# Patient Record
Sex: Female | Born: 2005 | Race: Black or African American | Hispanic: No | Marital: Single | Smoking: Never smoker
Health system: Southern US, Community
[De-identification: ages and names within clinical notes are randomized; demographics above are authoritative.]

## PROBLEM LIST (undated history)

## (undated) ENCOUNTER — Inpatient Hospital Stay (HOSPITAL_COMMUNITY): Payer: Self-pay

## (undated) DIAGNOSIS — T7840XA Allergy, unspecified, initial encounter: Secondary | ICD-10-CM

## (undated) DIAGNOSIS — E0590 Thyrotoxicosis, unspecified without thyrotoxic crisis or storm: Secondary | ICD-10-CM

## (undated) DIAGNOSIS — J45909 Unspecified asthma, uncomplicated: Secondary | ICD-10-CM

## (undated) HISTORY — DX: Allergy, unspecified, initial encounter: T78.40XA

---
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
## 2024-02-09 ENCOUNTER — Encounter (HOSPITAL_COMMUNITY): Payer: Self-pay

## 2024-02-09 ENCOUNTER — Other Ambulatory Visit: Payer: Self-pay

## 2024-02-09 ENCOUNTER — Inpatient Hospital Stay (HOSPITAL_COMMUNITY)
Admission: AD | Admit: 2024-02-09 | Discharge: 2024-02-10 | Disposition: A | Discharge status: Home / Self Care | Attending: Obstetrics & Gynecology | Admitting: Obstetrics & Gynecology

## 2024-02-09 DIAGNOSIS — O219 Vomiting of pregnancy, unspecified: Secondary | ICD-10-CM

## 2024-02-09 DIAGNOSIS — K920 Hematemesis: Secondary | ICD-10-CM | POA: Diagnosis present

## 2024-02-09 DIAGNOSIS — Z3A01 Less than 8 weeks gestation of pregnancy: Secondary | ICD-10-CM

## 2024-02-09 LAB — URINALYSIS, ROUTINE W REFLEX MICROSCOPIC
Bilirubin Urine: NEGATIVE
Glucose, UA: NEGATIVE mg/dL
Hgb urine dipstick: NEGATIVE
Ketones, ur: 80 mg/dL — AB
Leukocytes,Ua: NEGATIVE
Nitrite: NEGATIVE
Protein, ur: 100 mg/dL — AB
Specific Gravity, Urine: 1.031 — ABNORMAL HIGH (ref 1.005–1.030)
pH: 5 (ref 5.0–8.0)

## 2024-02-09 LAB — POCT PREGNANCY, URINE: Preg Test, Ur: POSITIVE — AB

## 2024-02-09 MED ORDER — ONDANSETRON HCL 4 MG/2ML IJ SOLN
4.0000 mg | Freq: Once | INTRAMUSCULAR | Status: AC
  Administered 2024-02-09: 4 mg via INTRAVENOUS
  Filled 2024-02-09: qty 2

## 2024-02-09 MED ORDER — LACTATED RINGERS IV BOLUS
1000.0000 mL | Freq: Once | INTRAVENOUS | Status: AC
  Administered 2024-02-09: 1000 mL via INTRAVENOUS

## 2024-02-09 MED ORDER — ONDANSETRON 4 MG PO TBDP: 4.0000 mg | ORAL_TABLET | Freq: Three times a day (TID) | ORAL | 0 refills | Status: DC | PRN

## 2024-02-09 MED ORDER — FAMOTIDINE IN NACL 20-0.9 MG/50ML-% IV SOLN
20.0000 mg | Freq: Once | INTRAVENOUS | Status: AC
  Administered 2024-02-09: 20 mg via INTRAVENOUS
  Filled 2024-02-09: qty 50

## 2024-02-09 NOTE — MAU Provider Note (Signed)
 History     CSN: [id number]  Arrival date and time: 02/09/24 2100   Event Date/Time   First Provider Initiated Contact with Patient 02/09/24 2222      Chief Complaint  Patient presents with   Emesis During Pregnancy   Dawn Powell , a  18 y.o. G1P0000 at [redacted]w[redacted]d presents to MAU with complaints of nausea and vomiting since Friday. Patient reports that she has been unable to keep anything down since Friday. She reports several episodes of emesis to the point of throwing up blood. She denies attempting to relieve symptoms at home. She reports having some hamburger helper last night but states that she threw it up. She denies vaginal bleeding, abnormal vaginal discharge and urinary symptoms.          OB History     Gravida  1   Para  0   Term  0   Preterm  0   AB  0   Living         SAB  0   IAB  0   Ectopic  0   Multiple      Live Births              Past Medical History:  Diagnosis Date   Allergy    Asthma    Asthma    Hyperthyroidism     History reviewed. No pertinent surgical history.  Family History  Problem Relation Age of Onset   Thyroid  disease Mother     Social History   Tobacco Use   Smoking status: Never   Smokeless tobacco: Never  Vaping Use   Vaping status: Never Used  Substance Use Topics   Alcohol use: Never   Drug use: Not Currently    Types: Marijuana    Comment: not since last week    Allergies: No Known Allergies  Medications Prior to Admission  Medication Sig Dispense Refill Last Dose/Taking   albuterol  (PROAIR  HFA) 108 (90 Base) MCG/ACT inhaler Inhale 1-2 puffs into the lungs as needed for shortness of breath.   More than a month   fluticasone  (FLOVENT  HFA) 110 MCG/ACT inhaler Inhale 1 puff into the lungs as needed.   More than a month    Review of Systems  Constitutional:  Negative for chills, fatigue and fever.  Eyes:  Negative for pain and visual disturbance.  Respiratory:  Negative for apnea, shortness of  breath and wheezing.   Cardiovascular:  Negative for chest pain and palpitations.  Gastrointestinal:  Positive for nausea and vomiting. Negative for abdominal pain, constipation and diarrhea.  Genitourinary:  Negative for difficulty urinating, dysuria, pelvic pain, vaginal bleeding, vaginal discharge and vaginal pain.  Musculoskeletal:  Negative for back pain.  Neurological:  Negative for seizures, weakness and headaches.  Psychiatric/Behavioral:  Negative for suicidal ideas.    Physical Exam   Blood pressure 120/60, pulse 93, temperature 97.6 F (36.4 C), temperature source Oral, resp. rate 18, height 5' 5.5 (1.664 m), weight 57.7 kg, last menstrual period 12/30/2023, SpO2 100%.  Physical Exam Vitals and nursing note reviewed.  Constitutional:      General: She is not in acute distress.    Appearance: Normal appearance.  HENT:     Head: Normocephalic.  Pulmonary:     Effort: Pulmonary effort is normal.  Abdominal:     Palpations: Abdomen is soft.     Tenderness: There is no abdominal tenderness.  Musculoskeletal:     Cervical back: Normal range of motion.  Skin:    General: Skin is warm and dry.  Neurological:     Mental Status: She is alert and oriented to person, place, and time.  Psychiatric:        Mood and Affect: Mood normal.     MAU Course  Procedures Orders Placed This Encounter  Procedures   Urinalysis, Routine w reflex microscopic -Urine, Clean Catch   Pregnancy, urine POC   Insert peripheral IV   Discharge patient Discharge disposition: 01-Home or Self Care; Discharge patient date: 02/09/2024   Discharge patient Discharge disposition: 01-Home or Self Care; Discharge patient date: 02/09/2024   Meds ordered this encounter  Medications   ondansetron (ZOFRAN) injection 4 mg   famotidine (PEPCID) IVPB 20 mg premix   lactated ringers bolus 1,000 mL   ondansetron (ZOFRAN-ODT) 4 MG disintegrating tablet    Sig: Take 1 tablet (4 mg total) by mouth every 8 (eight)  hours as needed for nausea or vomiting.    Dispense:  15 tablet    Refill:  0    Supervising Provider:   PRATT, TANYA S [[id number]]    MDM - UA revealed some dehydration  - IV bolus and antiemetics ordered.  - Patient requesting Ice chips  - PO Challenged and successful plan for discharge.   Assessment and Plan   1. Nausea and vomiting during pregnancy   2. [redacted] weeks gestation of pregnancy    - Reviewed nausea and vomiting as a normal discomfort of pregnancy  - Rx for Zofran sent to outpatient pharmacy  - Reviewed worsening signs and return precautions.  - Recommended that patient seek prenatal care at a office of her choosing.  - Patient discharged home in stable condition and may return to MAU as needed.   Dawn CHRISTELLA Cedar, MSN CNM  02/09/2024, 10:22 PM

## 2024-02-09 NOTE — MAU Note (Signed)
 Dawn Powell is a 18 y.o. at Unknown here in MAU reporting: nausea and vomiting that started 2 days ago. Patient states that she has not been able to keep anything down since then. Patient states that she started blood in her vomit yesterday. Reports upper abdominal pain that is constant. Patient reports positive upt today. Denies any vaginal bleeding  LMP: 12/30/23 Onset of complaint: 2 days ago Pain score: 10/10 Vitals:   02/09/24 2129  BP: 114/72  Pulse: (!) 110  Resp: 18  SpO2: 100%     FHT: n/a  Lab orders placed from triage: urine

## 2024-02-10 ENCOUNTER — Inpatient Hospital Stay (EMERGENCY_DEPARTMENT_HOSPITAL)
Admission: AD | Admit: 2024-02-10 | Discharge: 2024-02-10 | Disposition: A | Discharge status: Home / Self Care | Source: Home / Self Care | Attending: Obstetrics and Gynecology | Admitting: Obstetrics and Gynecology

## 2024-02-10 DIAGNOSIS — O219 Vomiting of pregnancy, unspecified: Secondary | ICD-10-CM | POA: Insufficient documentation

## 2024-02-10 DIAGNOSIS — Z3A01 Less than 8 weeks gestation of pregnancy: Secondary | ICD-10-CM | POA: Insufficient documentation

## 2024-02-10 LAB — URINALYSIS, ROUTINE W REFLEX MICROSCOPIC
Bilirubin Urine: NEGATIVE
Glucose, UA: NEGATIVE mg/dL
Hgb urine dipstick: NEGATIVE
Ketones, ur: 20 mg/dL — AB
Leukocytes,Ua: NEGATIVE
Nitrite: NEGATIVE
Protein, ur: 100 mg/dL — AB
Specific Gravity, Urine: 1.032 — ABNORMAL HIGH (ref 1.005–1.030)
pH: 5 (ref 5.0–8.0)

## 2024-02-10 MED ORDER — SCOPOLAMINE 1 MG/3DAYS TD PT72
1.0000 | MEDICATED_PATCH | Freq: Once | TRANSDERMAL | Status: DC
  Administered 2024-02-10: 1 mg via TRANSDERMAL
  Filled 2024-02-10: qty 1

## 2024-02-10 MED ORDER — ONDANSETRON 4 MG PO TBDP
4.0000 mg | ORAL_TABLET | Freq: Once | ORAL | Status: AC
  Administered 2024-02-10: 4 mg via ORAL
  Filled 2024-02-10: qty 1

## 2024-02-10 MED ORDER — ONDANSETRON HCL 4 MG/2ML IJ SOLN
4.0000 mg | Freq: Once | INTRAMUSCULAR | Status: AC
  Administered 2024-02-10: 4 mg via INTRAVENOUS
  Filled 2024-02-10: qty 2

## 2024-02-10 MED ORDER — LIDOCAINE VISCOUS HCL 2 % MT SOLN
15.0000 mL | Freq: Once | OROMUCOSAL | Status: AC
  Administered 2024-02-10: 15 mL via OROMUCOSAL
  Filled 2024-02-10: qty 15

## 2024-02-10 MED ORDER — PROMETHAZINE HCL 25 MG PO TABS
25.0000 mg | ORAL_TABLET | Freq: Once | ORAL | Status: AC
  Administered 2024-02-10: 25 mg via ORAL
  Filled 2024-02-10: qty 1

## 2024-02-10 MED ORDER — PROMETHAZINE HCL 25 MG/ML IJ SOLN
25.0000 mg | Freq: Once | INTRAVENOUS | Status: AC
  Administered 2024-02-10: 25 mg via INTRAVENOUS
  Filled 2024-02-10: qty 1

## 2024-02-10 MED ORDER — ONDANSETRON 4 MG PO TBDP: 4.0000 mg | ORAL_TABLET | Freq: Four times a day (QID) | ORAL | 5 refills | Status: DC | PRN

## 2024-02-10 MED ORDER — PROMETHAZINE HCL 25 MG PO TABS: 25.0000 mg | ORAL_TABLET | Freq: Four times a day (QID) | ORAL | 0 refills | Status: DC | PRN

## 2024-02-10 MED ORDER — SCOPOLAMINE 1 MG/3DAYS TD PT72: 1.0000 | MEDICATED_PATCH | TRANSDERMAL | 11 refills | Status: DC

## 2024-02-10 MED ORDER — ALUM & MAG HYDROXIDE-SIMETH 200-200-20 MG/5ML PO SUSP
30.0000 mL | Freq: Once | ORAL | Status: AC
  Administered 2024-02-10: 30 mL via ORAL
  Filled 2024-02-10: qty 30

## 2024-02-10 NOTE — Progress Notes (Signed)
Patient requesting ice chips.

## 2024-02-10 NOTE — MAU Provider Note (Signed)
 History     [id number]  Arrival date and time: 02/10/24 1228    Chief Complaint  Patient presents with   Abdominal Pain   Emesis     HPI Dawn Powell is a 18 y.o. at [redacted]w[redacted]d by sure LMP with PMHx notable for n/a, who presents for nausea and vomiting.   Was seen last evening for the same issue Was given IVF and passed PO challenge  Reports she is having intense nausea for the past few days Had some pineapple and watermelon this morning and has had terrible nausea, vomiting, and pain in her chest since then Has ODT zofran at home, took an hour before this happened, was the only one she tried No vaginal bleeding No fevers No pelvic pain Having trouble with both solids and liquids      OB History     Gravida  1   Para  0   Term  0   Preterm  0   AB  0   Living         SAB  0   IAB  0   Ectopic  0   Multiple      Live Births              Past Medical History:  Diagnosis Date   Allergy    Asthma    Asthma    Hyperthyroidism     No past surgical history on file.  Family History  Problem Relation Age of Onset   Thyroid  disease Mother     Social History   Socioeconomic History   Marital status: Single    Spouse name: Not on file   Number of children: Not on file   Years of education: Not on file   Highest education level: Not on file  Occupational History   Not on file  Tobacco Use   Smoking status: Never   Smokeless tobacco: Never  Vaping Use   Vaping status: Never Used  Substance and Sexual Activity   Alcohol use: Never   Drug use: Not Currently    Types: Marijuana    Comment: not since last week   Sexual activity: Yes    Birth control/protection: Condom  Other Topics Concern   Not on file  Social History Narrative   ** Merged History Encounter **       Lives with mother [place], brothers NyJhell Stokes (2001), Tera Razor (2004), sister Lolitha Tortora (2008). In school at [employer]   Social Drivers of Health    Financial Resource Strain: Patient Declined (12/20/2023)   Overall Financial Resource Strain (CARDIA)    Difficulty of Paying Living Expenses: Patient declined  Food Insecurity: Not on file  Transportation Needs: Not on file  Physical Activity: Inactive (12/20/2023)   Exercise Vital Sign    Days of Exercise per Week: 1 day    Minutes of Exercise per Session: 0 min  Stress: Stress Concern Present (12/20/2023)   [employer] of Occupational Health - Occupational Stress Questionnaire    Feeling of Stress: To some extent  Social Connections: Unknown (12/20/2023)   Social Connection and Isolation Panel    Frequency of Communication with Friends and Family: Once a week    Frequency of Social Gatherings with Friends and Family: Never    Attends Religious Services: 1 to 4 times per year    Active Member of [employer] or Organizations: No    Attends Banker Meetings: Not on file    Marital  Status: Not on file  Intimate Partner Violence: Not on file    No Known Allergies  No current facility-administered medications on file prior to encounter.   Current Outpatient Medications on File Prior to Encounter  Medication Sig Dispense Refill   albuterol  (PROAIR  HFA) 108 (90 Base) MCG/ACT inhaler Inhale 1-2 puffs into the lungs as needed for shortness of breath.     fluticasone  (FLOVENT  HFA) 110 MCG/ACT inhaler Inhale 1 puff into the lungs as needed.       ROS Pertinent positives and negative per HPI, all others reviewed and negative  Physical Exam   BP 132/72   Pulse 98   Temp 98.2 F (36.8 C)   Resp 18   Ht 5' 5.5 (1.664 m)   Wt 58.7 kg   LMP 12/30/2023 (Exact Date)   BMI 21.19 kg/m   Patient Vitals for the past 24 hrs:  BP Temp Pulse Resp Height Weight  02/10/24 1346 132/72 98.2 F (36.8 C) 98 18 5' 5.5 (1.664 m) 58.7 kg    Physical Exam Vitals reviewed.  Constitutional:      General: She is not in acute distress.    Appearance: She is well-developed. She is not  diaphoretic.  Eyes:     General: No scleral icterus. Pulmonary:     Effort: Pulmonary effort is normal. No respiratory distress.  Abdominal:     General: There is no distension.     Palpations: Abdomen is soft.     Tenderness: There is no abdominal tenderness. There is no guarding or rebound.  Skin:    General: Skin is warm and dry.  Neurological:     Mental Status: She is alert.     Coordination: Coordination normal.      Cervical Exam    Bedside Ultrasound Not performed.  My interpretation: n/a   Labs Results for orders placed or performed during the hospital encounter of 02/10/24 (from the past 24 hours)  Urinalysis, Routine w reflex microscopic -Urine, Clean Catch     Status: Abnormal   Collection Time: 02/10/24  2:30 PM  Result Value Ref Range   Color, Urine AMBER (A) YELLOW   APPearance HAZY (A) CLEAR   Specific Gravity, Urine 1.032 (H) 1.005 - 1.030   pH 5.0 5.0 - 8.0   Glucose, UA NEGATIVE NEGATIVE mg/dL   Hgb urine dipstick NEGATIVE NEGATIVE   Bilirubin Urine NEGATIVE NEGATIVE   Ketones, ur 20 (A) NEGATIVE mg/dL   Protein, ur 899 (A) NEGATIVE mg/dL   Nitrite NEGATIVE NEGATIVE   Leukocytes,Ua NEGATIVE NEGATIVE   RBC / HPF 0-5 0 - 5 RBC/hpf   WBC, UA 0-5 0 - 5 WBC/hpf   Bacteria, UA FEW (A) NONE SEEN   Squamous Epithelial / HPF 11-20 0 - 5 /HPF   Mucus PRESENT     Imaging No results found.  MAU Course  Procedures Lab Orders         Urinalysis, Routine w reflex microscopic -Urine, Clean Catch    Meds ordered this encounter  Medications   ondansetron (ZOFRAN-ODT) disintegrating tablet 4 mg   alum & mag hydroxide-simeth (MAALOX/MYLANTA) 200-200-20 MG/5ML suspension 30 mL   lidocaine (XYLOCAINE) 2 % viscous mouth solution 15 mL   promethazine (PHENERGAN) tablet 25 mg   scopolamine (TRANSDERM-SCOP) 1 MG/3DAYS 1 mg   promethazine (PHENERGAN) 25 mg in lactated ringers 1,000 mL infusion   ondansetron (ZOFRAN) injection 4 mg   ondansetron (ZOFRAN-ODT) 4  MG disintegrating tablet    Sig: Take  1 tablet (4 mg total) by mouth every 6 (six) hours as needed for nausea or vomiting.    Dispense:  30 tablet    Refill:  5   scopolamine (TRANSDERM-SCOP) 1 MG/3DAYS    Sig: Place 1 patch (1 mg total) onto the skin every 3 (three) days.    Dispense:  10 patch    Refill:  11   promethazine (PHENERGAN) 25 MG tablet    Sig: Take 1 tablet (25 mg total) by mouth every 6 (six) hours as needed for nausea or vomiting.    Dispense:  30 tablet    Refill:  0   Imaging Orders  No imaging studies ordered today    MDM Moderate (Level 3-4)  Assessment and Plan  #Nausea and vomiting of pregnancy #[redacted] weeks gestation of pregnancy Initially doing well with PO meds and PO challenge but then had renewed nausea and vomiting. Subsequently given IVF and IV anti emetics and able to pass PO challenge. Rx sent for multiple meds, instructed to take scheduled, if fails this regimen then recommend IV infusion center visits.    Dispo: discharged to home in stable condition    Donnice CHRISTELLA Carolus, MD/MPH 02/10/24 8:03 PM  Allergies as of 02/10/2024   No Known Allergies      Medication List     TAKE these medications    Flovent  HFA 110 MCG/ACT inhaler Generic drug: fluticasone  Inhale 1 puff into the lungs as needed.   ondansetron 4 MG disintegrating tablet Commonly known as: ZOFRAN-ODT Take 1 tablet (4 mg total) by mouth every 6 (six) hours as needed for nausea or vomiting. What changed: when to take this   ProAir  HFA 108 (90 Base) MCG/ACT inhaler Generic drug: albuterol  Inhale 1-2 puffs into the lungs as needed for shortness of breath.   promethazine 25 MG tablet Commonly known as: PHENERGAN Take 1 tablet (25 mg total) by mouth every 6 (six) hours as needed for nausea or vomiting.   scopolamine 1 MG/3DAYS Commonly known as: TRANSDERM-SCOP Place 1 patch (1 mg total) onto the skin every 3 (three) days.

## 2024-02-10 NOTE — MAU Note (Signed)
 Dawn Powell is a 18 y.o. at [redacted]w[redacted]d here in MAU reporting: was seen last night for n/v. Stated symptoms are worse having burning in her chest and throat and abd pain . Has taken zofran today but only helped for about 1 hour  LMP:  Onset of complaint: this morning Pain score: 10 Vitals:   02/10/24 1346  BP: 132/72  Pulse: 98  Resp: 18  Temp: 98.2 F (36.8 C)     FHT: n/a  Lab orders placed from triage: u/a

## 2024-02-10 NOTE — Discharge Instructions (Signed)
 You were seen for nausea and vomiting. We gave you IV fluids and nausea medicines and you got better. We recommend you take nausea medicines round the clock. If this fails then we will set up outpatient IV infusions for you.

## 2024-02-11 ENCOUNTER — Encounter (HOSPITAL_COMMUNITY): Payer: Self-pay | Admitting: Obstetrics and Gynecology

## 2024-02-11 ENCOUNTER — Inpatient Hospital Stay (HOSPITAL_COMMUNITY)
Admission: AD | Admit: 2024-02-11 | Discharge: 2024-02-11 | Disposition: A | Discharge status: Home / Self Care | Payer: Self-pay | Attending: Obstetrics and Gynecology | Admitting: Obstetrics and Gynecology

## 2024-02-11 ENCOUNTER — Other Ambulatory Visit: Payer: Self-pay | Admitting: Nurse Practitioner

## 2024-02-11 DIAGNOSIS — O210 Mild hyperemesis gravidarum: Secondary | ICD-10-CM | POA: Diagnosis not present

## 2024-02-11 DIAGNOSIS — O211 Hyperemesis gravidarum with metabolic disturbance: Secondary | ICD-10-CM | POA: Diagnosis not present

## 2024-02-11 DIAGNOSIS — Z3A01 Less than 8 weeks gestation of pregnancy: Secondary | ICD-10-CM | POA: Diagnosis not present

## 2024-02-11 DIAGNOSIS — O99280 Endocrine, nutritional and metabolic diseases complicating pregnancy, unspecified trimester: Secondary | ICD-10-CM

## 2024-02-11 DIAGNOSIS — E876 Hypokalemia: Secondary | ICD-10-CM

## 2024-02-11 DIAGNOSIS — O99281 Endocrine, nutritional and metabolic diseases complicating pregnancy, first trimester: Secondary | ICD-10-CM | POA: Diagnosis not present

## 2024-02-11 DIAGNOSIS — E860 Dehydration: Secondary | ICD-10-CM | POA: Diagnosis not present

## 2024-02-11 LAB — URINALYSIS, ROUTINE W REFLEX MICROSCOPIC
Bilirubin Urine: NEGATIVE
Glucose, UA: NEGATIVE mg/dL
Hgb urine dipstick: NEGATIVE
Ketones, ur: 80 mg/dL — AB
Leukocytes,Ua: NEGATIVE
Nitrite: NEGATIVE
Protein, ur: 100 mg/dL — AB
Specific Gravity, Urine: 1.033 — ABNORMAL HIGH (ref 1.005–1.030)
pH: 6 (ref 5.0–8.0)

## 2024-02-11 LAB — COMPREHENSIVE METABOLIC PANEL WITH GFR
ALT: 14 U/L (ref 0–44)
AST: 18 U/L (ref 15–41)
Albumin: 3.9 g/dL (ref 3.5–5.0)
Alkaline Phosphatase: 38 U/L (ref 38–126)
Anion gap: 12 (ref 5–15)
BUN: 11 mg/dL (ref 6–20)
CO2: 20 mmol/L — ABNORMAL LOW (ref 22–32)
Calcium: 8.8 mg/dL — ABNORMAL LOW (ref 8.9–10.3)
Chloride: 102 mmol/L (ref 98–111)
Creatinine, Ser: 0.72 mg/dL (ref 0.44–1.00)
GFR, Estimated: >60 mL/min (ref >60)
Glucose, Bld: 70 mg/dL (ref 70–99)
Potassium: 2.9 mmol/L — ABNORMAL LOW (ref 3.5–5.1)
Sodium: 134 mmol/L — ABNORMAL LOW (ref 135–145)
Total Bilirubin: 1.1 mg/dL (ref 0.0–1.2)
Total Protein: 7.7 g/dL (ref 6.5–8.1)

## 2024-02-11 LAB — CBC
HCT: 34.1 % — ABNORMAL LOW (ref 36.0–46.0)
Hemoglobin: 11.5 g/dL — ABNORMAL LOW (ref 12.0–15.0)
MCH: 33 pg (ref 26.0–34.0)
MCHC: 33.7 g/dL (ref 30.0–36.0)
MCV: 98 fL (ref 80.0–100.0)
Platelets: 247 K/uL (ref 150–400)
RBC: 3.48 MIL/uL — ABNORMAL LOW (ref 3.87–5.11)
RDW: 11.9 % (ref 11.5–15.5)
WBC: 8.3 K/uL (ref 4.0–10.5)
nRBC: 0 % (ref 0.0–0.2)

## 2024-02-11 MED ORDER — POTASSIUM CHLORIDE CRYS ER 20 MEQ PO TBCR: 20.0000 meq | EXTENDED_RELEASE_TABLET | Freq: Two times a day (BID) | ORAL | 1 refills | Status: DC

## 2024-02-11 MED ORDER — LACTATED RINGERS IV BOLUS
1000.0000 mL | Freq: Once | INTRAVENOUS | Status: AC
  Administered 2024-02-11: 1000 mL via INTRAVENOUS

## 2024-02-11 MED ORDER — POTASSIUM CHLORIDE 10 MEQ/100ML IV SOLN
10.0000 meq | Freq: Once | INTRAVENOUS | Status: AC
  Administered 2024-02-11: 10 meq via INTRAVENOUS
  Filled 2024-02-11: qty 100

## 2024-02-11 MED ORDER — METOCLOPRAMIDE HCL 5 MG/ML IJ SOLN
10.0000 mg | Freq: Once | INTRAMUSCULAR | Status: AC
  Administered 2024-02-11: 10 mg via INTRAVENOUS
  Filled 2024-02-11: qty 2

## 2024-02-11 MED ORDER — ONDANSETRON HCL 4 MG/2ML IJ SOLN
4.0000 mg | Freq: Once | INTRAMUSCULAR | Status: AC
  Administered 2024-02-11: 4 mg via INTRAVENOUS
  Filled 2024-02-11: qty 2

## 2024-02-11 MED ORDER — FAMOTIDINE IN NACL 20-0.9 MG/50ML-% IV SOLN
20.0000 mg | Freq: Once | INTRAVENOUS | Status: AC
  Administered 2024-02-11: 20 mg via INTRAVENOUS
  Filled 2024-02-11: qty 50

## 2024-02-11 MED ORDER — POTASSIUM CHLORIDE 20 MEQ PO PACK
40.0000 meq | PACK | Freq: Once | ORAL | Status: AC
  Administered 2024-02-11: 40 meq via ORAL
  Filled 2024-02-11: qty 2

## 2024-02-11 MED ORDER — DEXTROSE 5 % IN LACTATED RINGERS IV BOLUS: 1000.0000 mL | Freq: Once | INTRAVENOUS | Status: DC

## 2024-02-11 MED ORDER — LACTATED RINGERS IV SOLN: INTRAVENOUS | Status: DC

## 2024-02-11 NOTE — Discharge Instructions (Signed)
 I have scheduled you to receive weekly IV Hydration at the infusion center. They will contact you with your appointments. Continue to take the medications we have prescribed for your Nausea and vomiting and the potassium supplements as ordered.

## 2024-02-11 NOTE — MAU Note (Signed)
.  Harlee Mcchristian is a 18 y.o. at [redacted]w[redacted]d here in MAU reporting: N/V and cramping since this morning. Unrelieved by zofran PO. Denies VB and LOF.  LMP: na Onset of complaint: 1100 Pain score: na Vitals:   02/11/24 1525  BP: 118/77  Pulse: 74  Resp: 16  Temp: 98.5 F (36.9 C)  SpO2: 100%     FHT: na  Lab orders placed from triage: ua

## 2024-02-11 NOTE — MAU Provider Note (Signed)
 Dawn Powell is a 18 y.o. G1P0000 patient who presents to MAU today with complaint of she is 6 weeks 1 day reporting that she has been having some lower abdominal cramping which is resolved now and that she is still having nausea and vomiting and unable to keep down p.o. food or fluids.  Patient has been seen on 02/09/2024 and 02/10/2024 for similar complaints.  On 02/10/2024 she was given a regimen of Zofran and ODT, Phenergan, scopolamine patch, and also lidocaine viscus mouth solution.  Patient was also advised at her last visit if this regimen of antiemetics does not work for her then likely she will require IV infusion center visits.  Patient offers no OB complaints she denies vaginal bleeding leaking of fluid and offers no urinary or vaginal complaints.  She denies vaginal discharge, burning, irritation and denies any dysuria.  Patient does have a scopolamine patch on now and reports she did not pick up the Phenergan and her last Zofran was at 12 noon which she reports she was unable to keep down.  Prenatal care is scheduled to be received at [facility]    O BP (!) 121/59   Pulse 79   Temp 98.5 F (36.9 C) (Axillary)   Resp 16   Ht 5' 5.5 (1.664 m)   Wt 59.2 kg   LMP 12/30/2023 (Exact Date)   SpO2 100%   BMI 21.39 kg/m  Physical Exam Vitals and nursing note reviewed.  Constitutional:      Appearance: She is well-developed. She is ill-appearing.  HENT:     Head: Normocephalic.     Nose: Nose normal.     Mouth/Throat:     Mouth: Mucous membranes are moist.  Cardiovascular:     Rate and Rhythm: Normal rate.  Pulmonary:     Effort: Pulmonary effort is normal.  Abdominal:     Palpations: Abdomen is soft.     Tenderness: There is no abdominal tenderness.  Musculoskeletal:        General: Normal range of motion.     Cervical back: Normal range of motion.  Skin:    General: Skin is warm.     Coloration: Skin is pale.  Neurological:     Mental Status: She is alert and  oriented to person, place, and time.     Motor: Weakness present.  Psychiatric:        Behavior: Behavior normal.      MDM  HIGH  N/V in pregnancy  CBC: NM CMP:  Hypokalemia at  2.9 ( Will order [place] x 1 and oral potassium supplements)  UA: C/W Large ketones and protein c/w Dehydration IVF- LR Bolus given with LR Maintainance fluids Antiemetics ordered ( Reglan IVP) and (Pepcid IV for heartburn Dawn/Dawn) PO Challenge tolerated  EKG ordered due to hypokalemia ( NSR)   Differential diagnosis with nausea/vomiting includes but is not limited to: nausea due to pregnancy, hyperemesis gravidarum, viral infection, gastroenteritis, cholecystitis, pancreatitis, ACS, food poisoning, diabetic ketoacidosis, drug ingestion, gastroparesis, alcohol/opiate withdrawal   Orders Placed This Encounter  Procedures   Urinalysis, Routine w reflex microscopic -Urine, Clean Catch    Standing Status:   Standing    Number of Occurrences:   1    Specimen Source:   Urine, Clean Catch [76]   CBC    Standing Status:   Standing    Number of Occurrences:   1   Comprehensive metabolic panel    Standing Status:   Standing  Number of Occurrences:   1   EKG 12-Lead    Standing Status:   Standing    Number of Occurrences:   1    Notes:   Hypokalemia    Meds ordered this encounter  Medications   lactated ringers bolus 1,000 mL   metoCLOPramide (REGLAN) injection 10 mg   famotidine (PEPCID) IVPB 20 mg premix   ondansetron (ZOFRAN) injection 4 mg   DISCONTD: dextrose 5% lactated ringers bolus 1,000 mL   potassium chloride 10 mEq in 100 mL IVPB   potassium chloride (KLOR-CON) packet 40 mEq   lactated ringers infusion     Results for orders placed or performed during the hospital encounter of 02/11/24 (from the past 24 hours)  Urinalysis, Routine w reflex microscopic -Urine, Clean Catch     Status: Abnormal   Collection Time: 02/11/24  3:17 PM  Result Value Ref Range   Color, Urine AMBER (A) YELLOW    APPearance HAZY (A) CLEAR   Specific Gravity, Urine 1.033 (H) 1.005 - 1.030   pH 6.0 5.0 - 8.0   Glucose, UA NEGATIVE NEGATIVE mg/dL   Hgb urine dipstick NEGATIVE NEGATIVE   Bilirubin Urine NEGATIVE NEGATIVE   Ketones, ur 80 (A) NEGATIVE mg/dL   Protein, ur 899 (A) NEGATIVE mg/dL   Nitrite NEGATIVE NEGATIVE   Leukocytes,Ua NEGATIVE NEGATIVE   RBC / HPF 0-5 0 - 5 RBC/hpf   WBC, UA 0-5 0 - 5 WBC/hpf   Bacteria, UA RARE (A) NONE SEEN   Squamous Epithelial / HPF 6-10 0 - 5 /HPF   Mucus PRESENT   CBC     Status: Abnormal   Collection Time: 02/11/24  5:15 PM  Result Value Ref Range   WBC 8.3 4.0 - 10.5 K/uL   RBC 3.48 (L) 3.87 - 5.11 MIL/uL   Hemoglobin 11.5 (L) 12.0 - 15.0 g/dL   HCT 65.8 (L) 63.9 - 53.9 %   MCV 98.0 80.0 - 100.0 fL   MCH 33.0 26.0 - 34.0 pg   MCHC 33.7 30.0 - 36.0 g/dL   RDW 88.0 88.4 - 84.4 %   Platelets 247 150 - 400 K/uL   nRBC 0.0 0.0 - 0.2 %  Comprehensive metabolic panel     Status: Abnormal   Collection Time: 02/11/24  5:15 PM  Result Value Ref Range   Sodium 134 (L) 135 - 145 mmol/L   Potassium 2.9 (L) 3.5 - 5.1 mmol/L   Chloride 102 98 - 111 mmol/L   CO2 20 (L) 22 - 32 mmol/L   Glucose, Bld 70 70 - 99 mg/dL   BUN 11 6 - 20 mg/dL   Creatinine, Ser 9.27 0.44 - 1.00 mg/dL   Calcium 8.8 (L) 8.9 - 10.3 mg/dL   Total Protein 7.7 6.5 - 8.1 g/dL   Albumin 3.9 3.5 - 5.0 g/dL   AST 18 15 - 41 U/L   ALT 14 0 - 44 U/L   Alkaline Phosphatase 38 38 - 126 U/L   Total Bilirubin 1.1 0.0 - 1.2 mg/dL   GFR, Estimated >39 >39 mL/min   Anion gap 12 5 - 15      I have reviewed the patient chart and performed the physical exam . I have ordered & interpreted the lab results and reviewed them with her  Medications ordered as stated above/ below.  A/P as described below.  Counseling and education provided and patient agreeable  with plan as described below. Verbalized understanding.    ASSESSMENT Medical  screening exam complete  Hyperemesis arising during  pregnancy  Hypokalemia  Dehydration during pregnancy  [redacted] weeks gestation of pregnancy    PLAN Future Appointments  Date Time Provider Department Center  02/26/2024  9:15 AM [facility] INTAKE [facility] None    Discharge from MAU in stable condition  IV Infusions for hyperemesis  ordered ( Message sent to the infusion center for scheduling)  See AVS for full description of educational information and instructions provided to the patient at time of discharge   Warning signs for worsening condition that would warrant emergency follow-up discussed Patient may return to MAU as needed  ------------------------------------------------------------------------------------------------------------- Olam Dalton, MSN, [facility], Center for [facility]   This chart was dictated using voice recognition software, Dragon. Despite the best efforts of this provider to proofread and correct errors, errors may still occur which can change documentation meaning.

## 2024-02-11 NOTE — MAU Note (Addendum)
 Dawn Powell is a 18 y.o. at [redacted]w[redacted]d here in MAU reporting: had some cramps in lower abd this morning- going now.  Hasn't been able to keep down any of the meds they gave her to take or any food. She's hungry, but everything is just so nasty. Upper abd is sore, like she is going to throw up, calmed down after eating ice. then it feels stuck in her throat and make it hard to breath.  Onset of complaint: ongoing. Pain score: mild Vitals:   02/11/24 1525  BP: 118/77  Pulse: 74  Resp: 16  Temp: 98.5 F (36.9 C)  SpO2: 100%      Lab orders placed from triage:  urine in processing    Has scope patch on .  Did not pick up Phenergan.  Last Zofran was at noon, she threw it up. Pt to lobby. Not actively retching or vomiting.

## 2024-02-15 ENCOUNTER — Other Ambulatory Visit: Payer: Self-pay

## 2024-02-15 ENCOUNTER — Encounter (HOSPITAL_COMMUNITY): Payer: Self-pay | Admitting: Obstetrics and Gynecology

## 2024-02-15 ENCOUNTER — Other Ambulatory Visit: Payer: Self-pay | Admitting: Nurse Practitioner

## 2024-02-15 ENCOUNTER — Inpatient Hospital Stay (HOSPITAL_COMMUNITY)
Admission: AD | Admit: 2024-02-15 | Discharge: 2024-02-15 | Disposition: A | Discharge status: Home / Self Care | Attending: Obstetrics and Gynecology | Admitting: Obstetrics and Gynecology

## 2024-02-15 DIAGNOSIS — Z3A01 Less than 8 weeks gestation of pregnancy: Secondary | ICD-10-CM

## 2024-02-15 DIAGNOSIS — O26891 Other specified pregnancy related conditions, first trimester: Secondary | ICD-10-CM

## 2024-02-15 DIAGNOSIS — O211 Hyperemesis gravidarum with metabolic disturbance: Secondary | ICD-10-CM | POA: Insufficient documentation

## 2024-02-15 DIAGNOSIS — R1030 Lower abdominal pain, unspecified: Secondary | ICD-10-CM | POA: Insufficient documentation

## 2024-02-15 DIAGNOSIS — O26899 Other specified pregnancy related conditions, unspecified trimester: Secondary | ICD-10-CM

## 2024-02-15 DIAGNOSIS — R1110 Vomiting, unspecified: Secondary | ICD-10-CM | POA: Diagnosis not present

## 2024-02-15 DIAGNOSIS — R109 Unspecified abdominal pain: Secondary | ICD-10-CM | POA: Diagnosis not present

## 2024-02-15 DIAGNOSIS — O210 Mild hyperemesis gravidarum: Secondary | ICD-10-CM

## 2024-02-15 DIAGNOSIS — O219 Vomiting of pregnancy, unspecified: Secondary | ICD-10-CM | POA: Diagnosis present

## 2024-02-15 LAB — CBC
HCT: 39 % (ref 36.0–46.0)
Hemoglobin: 13.1 g/dL (ref 12.0–15.0)
MCH: 32.9 pg (ref 26.0–34.0)
MCHC: 33.6 g/dL (ref 30.0–36.0)
MCV: 98 fL (ref 80.0–100.0)
Platelets: 300 K/uL (ref 150–400)
RBC: 3.98 MIL/uL (ref 3.87–5.11)
RDW: 11.6 % (ref 11.5–15.5)
WBC: 7.7 K/uL (ref 4.0–10.5)
nRBC: 0 % (ref 0.0–0.2)

## 2024-02-15 LAB — COMPREHENSIVE METABOLIC PANEL WITH GFR
ALT: 13 U/L (ref 0–44)
AST: 20 U/L (ref 15–41)
Albumin: 4.5 g/dL (ref 3.5–5.0)
Alkaline Phosphatase: 45 U/L (ref 38–126)
Anion gap: 12 (ref 5–15)
BUN: 12 mg/dL (ref 6–20)
CO2: 21 mmol/L — ABNORMAL LOW (ref 22–32)
Calcium: 9.8 mg/dL (ref 8.9–10.3)
Chloride: 105 mmol/L (ref 98–111)
Creatinine, Ser: 0.82 mg/dL (ref 0.44–1.00)
GFR, Estimated: >60 mL/min
Glucose, Bld: 81 mg/dL (ref 70–99)
Potassium: 3.5 mmol/L (ref 3.5–5.1)
Sodium: 138 mmol/L (ref 135–145)
Total Bilirubin: 0.9 mg/dL (ref 0.0–1.2)
Total Protein: 9.1 g/dL — ABNORMAL HIGH (ref 6.5–8.1)

## 2024-02-15 LAB — URINALYSIS, ROUTINE W REFLEX MICROSCOPIC
Bilirubin Urine: NEGATIVE
Glucose, UA: NEGATIVE mg/dL
Hgb urine dipstick: NEGATIVE
Ketones, ur: 20 mg/dL — AB
Leukocytes,Ua: NEGATIVE
Nitrite: NEGATIVE
Protein, ur: 100 mg/dL — AB
Specific Gravity, Urine: 1.032 — ABNORMAL HIGH (ref 1.005–1.030)
pH: 6 (ref 5.0–8.0)

## 2024-02-15 LAB — MAGNESIUM: Magnesium: 2 mg/dL (ref 1.7–2.4)

## 2024-02-15 MED ORDER — METOCLOPRAMIDE HCL 10 MG PO TABS: 10.0000 mg | ORAL_TABLET | Freq: Four times a day (QID) | ORAL | 0 refills | Status: DC

## 2024-02-15 MED ORDER — FAMOTIDINE 20 MG PO TABS: 20.0000 mg | ORAL_TABLET | Freq: Two times a day (BID) | ORAL | 0 refills | Status: DC

## 2024-02-15 MED ORDER — GLYCOPYRROLATE 2 MG PO TABS: 2.0000 mg | ORAL_TABLET | Freq: Three times a day (TID) | ORAL | 3 refills | Status: DC | PRN

## 2024-02-15 MED ORDER — GLYCOPYRROLATE 0.2 MG/ML IJ SOLN
0.2000 mg | Freq: Once | INTRAMUSCULAR | Status: AC
  Administered 2024-02-15: 0.2 mg via INTRAVENOUS
  Filled 2024-02-15: qty 1

## 2024-02-15 MED ORDER — FAMOTIDINE IN NACL 20-0.9 MG/50ML-% IV SOLN
20.0000 mg | Freq: Once | INTRAVENOUS | Status: AC
  Administered 2024-02-15: 20 mg via INTRAVENOUS

## 2024-02-15 MED ORDER — LACTATED RINGERS IV BOLUS
1000.0000 mL | Freq: Once | INTRAVENOUS | Status: AC
  Administered 2024-02-15: 1000 mL via INTRAVENOUS

## 2024-02-15 MED ORDER — METOCLOPRAMIDE HCL 5 MG/ML IJ SOLN
10.0000 mg | Freq: Once | INTRAMUSCULAR | Status: AC
  Administered 2024-02-15: 10 mg via INTRAVENOUS
  Filled 2024-02-15: qty 2

## 2024-02-15 NOTE — MAU Provider Note (Signed)
 S Ms. Dawn Powell is a 18 y.o. G1P0000 patient who presents to MAU today with complaint of stating that she is 6 weeks 5 days reporting she is having lower abdominal pain and consistent nausea and vomiting..  Patient has been seen here in the MAU for similar complaints on 02/09/2024, 02/10/2024, 02/11/2024 and again today.  IV fluid hydration orders have been placed at the the infusion center and patient reports she is awaiting her appointments since  they have not called her yet.  Patient has a regimen for hyperemesis at home which consists of Zofran, Phenergan, Reglan, scopolamine patch, and Pepcid.  She was also taking potassium supplements for hypokalemia that was found on 02/11/2024.  She reports that the inpatient regimen given on 02/11/2024 was helpful and she would like to be rehydrated again with similar medications.  Patient  denied any illicit drug use and states she has not used marijuana since she found out she was pregnant.  O BP 136/77 (BP Location: Right Arm)   Pulse 90   Temp 98.4 F (36.9 C) (Oral)   Resp 20   Ht 5' 5.5 (1.664 m)   Wt 58.4 kg   LMP 12/30/2023 (Exact Date)   SpO2 100%   BMI 21.09 kg/m  Physical Exam Vitals and nursing note reviewed.  Constitutional:      General: She is not in acute distress.    Appearance: Normal appearance. She is ill-appearing.  HENT:     Head: Normocephalic.     Nose: Nose normal.  Cardiovascular:     Rate and Rhythm: Normal rate.  Pulmonary:     Effort: Pulmonary effort is normal.  Abdominal:     Palpations: Abdomen is soft.  Musculoskeletal:        General: Normal range of motion.     Cervical back: Normal range of motion.  Skin:    General: Skin is warm.  Neurological:     Mental Status: She is alert and oriented to person, place, and time.  Psychiatric:        Behavior: Behavior normal.    MDM  HIGH  N/V in pregnancy  CBC: NM CMP: Potassium improved at 3.5 Serum magnesium: 2.0 UA (ketones and proteinuria  present consistent with dehydration) IVF LR Bolus Antiemetics (IV push Reglan, Robinul, Pepcid IV piggyback) patient reports that Zofran is not effective for her PO Challenge tolerated     Differential diagnosis with nausea/vomiting includes but is not limited to: nausea due to pregnancy, hyperemesis gravidarum, viral infection, gastroenteritis, cholecystitis, pancreatitis, ACS, food poisoning, diabetic ketoacidosis, drug ingestion, gastroparesis, alcohol/opiate withdrawal   Orders Placed This Encounter  Procedures   Urinalysis, Routine w reflex microscopic -Urine, Clean Catch    Standing Status:   Standing    Number of Occurrences:   1    Specimen Source:   Urine, Clean Catch [76]   CBC    Standing Status:   Standing    Number of Occurrences:   1   Comprehensive metabolic panel    Standing Status:   Standing    Number of Occurrences:   1   Magnesium    Standing Status:   Standing    Number of Occurrences:   1    Meds ordered this encounter  Medications   lactated ringers bolus 1,000 mL   glycopyrrolate (ROBINUL) injection 0.2 mg   metoCLOPramide (REGLAN) injection 10 mg   famotidine (PEPCID) IVPB 20 mg premix      Results for orders placed  or performed during the hospital encounter of 02/15/24 (from the past 24 hours)  Urinalysis, Routine w reflex microscopic -Urine, Clean Catch     Status: Abnormal   Collection Time: 02/15/24 12:29 PM  Result Value Ref Range   Color, Urine AMBER (A) YELLOW   APPearance HAZY (A) CLEAR   Specific Gravity, Urine 1.032 (H) 1.005 - 1.030   pH 6.0 5.0 - 8.0   Glucose, UA NEGATIVE NEGATIVE mg/dL   Hgb urine dipstick NEGATIVE NEGATIVE   Bilirubin Urine NEGATIVE NEGATIVE   Ketones, ur 20 (A) NEGATIVE mg/dL   Protein, ur 899 (A) NEGATIVE mg/dL   Nitrite NEGATIVE NEGATIVE   Leukocytes,Ua NEGATIVE NEGATIVE   RBC / HPF 0-5 0 - 5 RBC/hpf   WBC, UA 0-5 0 - 5 WBC/hpf   Bacteria, UA MANY (A) NONE SEEN   Squamous Epithelial / HPF 11-20 0 - 5 /HPF    Mucus PRESENT   CBC     Status: None   Collection Time: 02/15/24  1:30 PM  Result Value Ref Range   WBC 7.7 4.0 - 10.5 K/uL   RBC 3.98 3.87 - 5.11 MIL/uL   Hemoglobin 13.1 12.0 - 15.0 g/dL   HCT 60.9 63.9 - 53.9 %   MCV 98.0 80.0 - 100.0 fL   MCH 32.9 26.0 - 34.0 pg   MCHC 33.6 30.0 - 36.0 g/dL   RDW 88.3 88.4 - 84.4 %   Platelets 300 150 - 400 K/uL   nRBC 0.0 0.0 - 0.2 %  Comprehensive metabolic panel     Status: Abnormal   Collection Time: 02/15/24  1:30 PM  Result Value Ref Range   Sodium 138 135 - 145 mmol/L   Potassium 3.5 3.5 - 5.1 mmol/L   Chloride 105 98 - 111 mmol/L   CO2 21 (L) 22 - 32 mmol/L   Glucose, Bld 81 70 - 99 mg/dL   BUN 12 6 - 20 mg/dL   Creatinine, Ser 9.17 0.44 - 1.00 mg/dL   Calcium 9.8 8.9 - 89.6 mg/dL   Total Protein 9.1 (H) 6.5 - 8.1 g/dL   Albumin 4.5 3.5 - 5.0 g/dL   AST 20 15 - 41 U/L   ALT 13 0 - 44 U/L   Alkaline Phosphatase 45 38 - 126 U/L   Total Bilirubin 0.9 0.0 - 1.2 mg/dL   GFR, Estimated >39 >39 mL/min   Anion gap 12 5 - 15  Magnesium     Status: None   Collection Time: 02/15/24  1:30 PM  Result Value Ref Range   Magnesium 2.0 1.7 - 2.4 mg/dL     I have reviewed the patient chart and performed the physical exam . I have ordered & interpreted the lab results and reviewed them with the patient Medications ordered as stated below.  A/P as described below.  Counseling and education provided and patient agreeable  with plan as described below. Verbalized understanding.    ASSESSMENT Medical screening exam complete  Hyperemesis arising during pregnancy  Abdominal pain affecting pregnancy  Hyperemesis  [redacted] weeks gestation of pregnancy     PLAN Future Appointments  Date Time Provider Department Center  02/26/2024  9:15 AM [facility] INTAKE [facility] None    Discharge from MAU in stable condition  See AVS for full description of educational information and instructions provided to the patient at time of discharge  Warning signs  for worsening condition that would warrant emergency follow-up discussed Patient may return to MAU as needed  Littie Olam LABOR, NP 02/15/2024 3:43 PM   This chart was dictated using voice recognition software, Dragon. Despite the best efforts of this provider to proofread and correct errors, errors may still occur which can change documentation meaning.

## 2024-02-15 NOTE — Discharge Instructions (Signed)
 Orders have been placed for Weekly IV Fluid Hydration and I have sent them a message today to contact you . You should receive a call with appointments by Tuesday 02/18/24. If not please call the [facility] where you will be receiving your Prenatal care

## 2024-02-15 NOTE — MAU Note (Signed)
 Dawn Powell is a 18 y.o. at [redacted]w[redacted]d here in MAU reporting: she's having lower abdominal pain that hurts will all activity.  Denies VB.  Also reports is having N/V.  Reports able to keep down food & liquids at times.    LMP: 12/30/2023 Onset of complaint: ongoing Pain score: 10 Vitals:   02/15/24 1231  BP: 136/77  Pulse: 90  Resp: 20  Temp: 98.4 F (36.9 C)  SpO2: 100%     FHT: NA  Lab orders placed from triage: UA

## 2024-02-15 NOTE — MAU Note (Signed)
 Ivf's finished, pt reports she feels better. Reports pain is better. No vomiting since ivf's started. Provider notified

## 2024-02-19 ENCOUNTER — Other Ambulatory Visit (HOSPITAL_COMMUNITY): Payer: Self-pay | Admitting: Nurse Practitioner

## 2024-02-20 ENCOUNTER — Inpatient Hospital Stay (HOSPITAL_COMMUNITY)
Admission: RE | Admit: 2024-02-20 | Discharge: 2024-02-20 | Disposition: A | Discharge status: Home / Self Care | Source: Ambulatory Visit | Attending: Nurse Practitioner | Admitting: Nurse Practitioner

## 2024-02-26 ENCOUNTER — Other Ambulatory Visit (HOSPITAL_COMMUNITY)
Admission: RE | Admit: 2024-02-26 | Discharge: 2024-02-26 | Disposition: A | Discharge status: Home / Self Care | Source: Ambulatory Visit | Attending: Obstetrics | Admitting: Obstetrics

## 2024-02-26 ENCOUNTER — Other Ambulatory Visit (INDEPENDENT_AMBULATORY_CARE_PROVIDER_SITE_OTHER): Payer: Self-pay

## 2024-02-26 ENCOUNTER — Ambulatory Visit (INDEPENDENT_AMBULATORY_CARE_PROVIDER_SITE_OTHER): Payer: Self-pay | Admitting: *Deleted

## 2024-02-26 VITALS — BP 131/79 | HR 103

## 2024-02-26 DIAGNOSIS — Z3401 Encounter for supervision of normal first pregnancy, first trimester: Secondary | ICD-10-CM

## 2024-02-26 DIAGNOSIS — O3680X0 Pregnancy with inconclusive fetal viability, not applicable or unspecified: Secondary | ICD-10-CM

## 2024-02-26 DIAGNOSIS — Z3A08 8 weeks gestation of pregnancy: Secondary | ICD-10-CM

## 2024-02-26 DIAGNOSIS — Z3480 Encounter for supervision of other normal pregnancy, unspecified trimester: Secondary | ICD-10-CM | POA: Insufficient documentation

## 2024-02-26 MED ORDER — VITAFOL GUMMIES 3.33-0.333-34.8 MG PO CHEW: 3.0000 | CHEWABLE_TABLET | Freq: Every day | ORAL | 11 refills | Status: AC

## 2024-02-26 MED ORDER — BLOOD PRESSURE KIT DEVI: 1.0000 | 0 refills | Status: DC

## 2024-02-26 NOTE — Patient Instructions (Signed)
 The [facility] for [employer] has a partnership with the [employer] to provide prenatal navigation for the most needed resources in our community. In order to see how we can help connect you to these resources we need consent to contact you. Please complete the very short consent using the link below:   English Link: https://[place].tfaforms.net/283?site=16  Spanish Link: https://[place].tfaforms.net/287?site=16  Our practice his participating in a study that provides no-cost doula care. ACURE4Moms is a study looking at how doula care can reduce birthing disparities for Black and brown birthing people. We like to refer patients as soon as possible, but definitely before 28 weeks so patients can get to know their doula.    A doula is trained to provide support before, during and just after you give birth. While doulas do not provide medical care, they do provide emotional, physical and educational support. Doulas can help reduce your stress and comfort you and your partner. They can help you cope with labor by helping you use breathing techniques, massage, creative labor positioning, essential oils and affirmations.   ACURE4Moms is a research study trying to reduce:   low birthweight babies  emergency department visits & hospitalizations for birthing persons and their babies  depression among birthing people  discrimination in pregnancy-related care ACURE4Moms is trying out 2 programs designed by  people who have given birth. These programs include: 1. Sharing patient data and warning alerts with clinic staff to keep them accountable for their patients' outcomes and providing tools to help them  reduce bias in care. 2. Matching eligible patients with doulas from the  same community as the patients.  If you would like to participate in this study, please visit:   http://carroll-castaneda.info/  Options for Doula Care in the [place] Area  As you review  your birthing options, consider having a birth doula. A doula is trained to provide support before, during and just after you give birth. There are also postpartum doulas that help you adjust to new parenthood.  While doulas do not provide medical care, they do provide emotional, physical and educational support. A few months before your baby arrives, doulas can help answer questions, ease concerns and help you create and support your birthing plan.    Doulas can help reduce your stress and comfort you and your partner. They can help you cope with labor by helping you use breathing techniques, massage, creative labor positioning, essential oils and affirmations.   Studies show that the benefits of having a doula include:   A more positive birth experience  Fewer requests for pain-relief medication  Less likelihood of cesarean section, commonly called a c-section   Doulas are typically hired via a [employer] account planner between you and the doula. We are happy to provide a list of the most active doulas in the area, all of whom are credentialed by [facility] and will not count as a visitor at your birth.  There are several options for no-cost doula care at our hospital, including:  [facility] Volunteer Doula Program [employer] Program A Cure 4 Moms Doula Study (available only at [employer] [facility], [place], [place] and [facility] offices)  For more information on these programs or to receive a list of doulas active in our area, please email [email or web address]

## 2024-02-26 NOTE — Progress Notes (Signed)
 New OB Intake  I connected with Serafina Pereyra  on 02/26/24 at  9:15 AM EDT by {Contact:24193} Video Visit and verified that I am speaking with the correct person using two identifiers. Nurse is located at [facility] and pt is located at ***.  I discussed the limitations, risks, security and privacy concerns of performing an evaluation and management service by telephone and the availability of in person appointments. I also discussed with the patient that there may be a patient responsible charge related to this service. The patient expressed understanding and agreed to proceed.  I explained I am completing New OB Intake today. We discussed EDD of *** based on {EDD:33166}. Pt is G1P0000. I reviewed her allergies, medications and Medical/Surgical/OB history.    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 Discussed the nature of our practice with multiple providers including residents and students as well as female and female providers. Due to the size of the practice, the delivering provider may not be the same as those providing prenatal care.   Patient is not interested in water birth.  MyChart/Babyscripts MyChart access verified. I explained pt will have some visits in office and some virtually. Babyscripts instructions given and order placed. Patient verifies receipt of registration text/e-mail. Account successfully created and app downloaded. If patient is a candidate for Optimized scheduling, add to sticky note.   Blood Pressure Cuff/Weight Scale Blood pressure cuff ordered for  patient to pick-up from [employer]. Explained after first prenatal appt pt will check weekly and document in Babyscripts. Patient does not have weight scale; patient may purchase if they desire to track weight weekly in Babyscripts.  Anatomy US  Explained first scheduled US  will be around 19 weeks. Anatomy US  scheduled for *** at ***.  Is patient a CenteringPregnancy candidate?  {Accepted:19197::Accepted,Not a Candidate,Declined} Declined due to {Declined:19197::Schedule,Childcare,Group setting,Support person concern,Declined to say,Enrolled in [facility],***} Not a candidate due to {Not a Candidate:19197::DM,CHTN, medication controlled,Language barrier,>28 weeks,Multiple gestation (mono-mono or mono-di),Complex coordination of care needed,***} If accepted,    Is patient a Mom+Baby Combined Care candidate?  {Accepted:19197::Accepted,Declined,Not a candidate,***}   If accepted, confirm patient does not intend to move from the area for at least 12 months, then notify Mom+Baby staff  Is patient a candidate for Babyscripts Optimization? {babyscripts:31704}   First visit review I reviewed new OB appt with patient. Explained pt will be seen by *** at first visit. Discussed [place] genetic screening with patient. *** [facility]or[facility] and [facility].. Routine prenatal labs {collected today/needed at new OB visit:9024}   Last Pap No results found for: DIAGPAP  Rocky CHRISTELLA Ober, RN 02/26/2024  10:06 AM

## 2024-02-27 ENCOUNTER — Encounter (HOSPITAL_COMMUNITY)

## 2024-02-27 DIAGNOSIS — J4530 Mild persistent asthma, uncomplicated: Secondary | ICD-10-CM | POA: Insufficient documentation

## 2024-02-27 LAB — CERVICOVAGINAL ANCILLARY ONLY
Chlamydia: NEGATIVE
Comment: NEGATIVE
Comment: NORMAL
Neisseria Gonorrhea: NEGATIVE

## 2024-02-28 LAB — CULTURE, OB URINE

## 2024-02-28 LAB — URINE CULTURE, OB REFLEX

## 2024-03-05 ENCOUNTER — Inpatient Hospital Stay (HOSPITAL_COMMUNITY): Admission: RE | Admit: 2024-03-05 | Source: Ambulatory Visit

## 2024-03-12 ENCOUNTER — Encounter (HOSPITAL_COMMUNITY)

## 2024-03-26 ENCOUNTER — Encounter: Payer: Self-pay | Admitting: Physician Assistant

## 2024-03-26 ENCOUNTER — Ambulatory Visit (INDEPENDENT_AMBULATORY_CARE_PROVIDER_SITE_OTHER): Payer: Self-pay | Admitting: Physician Assistant

## 2024-03-26 VITALS — BP 123/84 | HR 104 | Wt 136.6 lb

## 2024-03-26 DIAGNOSIS — Z3A12 12 weeks gestation of pregnancy: Secondary | ICD-10-CM | POA: Diagnosis not present

## 2024-03-26 DIAGNOSIS — Z3401 Encounter for supervision of normal first pregnancy, first trimester: Secondary | ICD-10-CM | POA: Diagnosis not present

## 2024-03-26 DIAGNOSIS — Z3480 Encounter for supervision of other normal pregnancy, unspecified trimester: Secondary | ICD-10-CM

## 2024-03-26 MED ORDER — ASPIRIN 81 MG PO TBEC: 81.0000 mg | DELAYED_RELEASE_TABLET | Freq: Every day | ORAL | 12 refills | Status: DC

## 2024-03-26 NOTE — Progress Notes (Signed)
 PRENATAL VISIT NOTE  Subjective:  Dawn Powell is a 18 y.o. G1P0000 at [redacted]w[redacted]d being seen today for her first prenatal visit for this pregnancy.  She is currently monitored for the following issues for this low-risk pregnancy and has Asthma, moderate persistent; Allergic rhinitis; Precocious adrenarche; Abdominal pain; Rash; Encounter for screening and preventative care; Abnormal TSH; Encounter to establish care; Hyperemesis arising during pregnancy; Supervision of other normal pregnancy, antepartum; Graves disease; Mild persistent asthma; Menorrhagia; and Dysmenorrhea on their problem list.  Patient reports no complaints.  Contractions: Not present. Vag. Bleeding: None.  Movement: Absent. Denies leaking of fluid.   She is planning to breastfeed. Desires OCPs for contraception.   The following portions of the patient's history were reviewed and updated as appropriate: allergies, current medications, past family history, past medical history, past social history, past surgical history and problem list.   Objective:   Vitals:   03/26/24 1001  BP: 123/84  Pulse: (!) 104  Weight: 136 lb 9.6 oz (62 kg)    Fetal Status: Fetal Heart Rate (bpm): 161   Movement: Absent     General:  Alert, oriented and cooperative. Patient is in no acute distress.  Skin: Skin is warm and dry. No rash noted.   Cardiovascular: Normal heart rate and rhythm noted  Respiratory: Normal respiratory effort, no problems with respiration noted. Clear to auscultation.   Abdomen: Soft, gravid, appropriate for gestational age. Normal bowel sounds. Non-tender. Pain/Pressure: Absent     Pelvic: Cervical exam deferred       Normal cervical contour, no lesions, no bleeding following pap, normal discharge  Extremities: Normal range of motion.  Edema: Trace  Mental Status: Normal mood and affect. Normal behavior. Normal judgment and thought content.    Indications for ASA therapy (per uptodate) One of the  following: Previous pregnancy with preeclampsia, especially early onset and with an adverse outcome No Multifetal gestation No Chronic hypertension No Type 1 or 2 diabetes mellitus No Chronic kidney disease No Autoimmune disease (antiphospholipid syndrome, systemic lupus erythematosus) No  Two or more of the following: Nulliparity Yes Obesity (body mass index >30 kg/m2) No Family history of preeclampsia in mother or sister No Age >=35 years No Sociodemographic characteristics (African American race, low socioeconomic level) Yes Personal risk factors (eg, previous pregnancy with low birth weight or small for gestational age infant, previous adverse pregnancy outcome [eg, stillbirth], interval >10 years between pregnancies) No  Assessment and Plan:  Pregnancy: G1P0000 at [redacted]w[redacted]d  1. Supervision of other normal pregnancy, antepartum (Primary) Initial labs drawn. Continue prenatal vitamins. Genetic Screening discussed: NIPS, carrier screening and AFP  Ultrasound discussed; fetal anatomic survey: 05/15/24 Problem list reviewed and updated. Reviewed Brx optimized schedule, patient agreeable The nature of [facility] with multiple MDs and other Advanced Practice Providers was explained to patient; also emphasized that residents, students are part of our team. Routine obstetric precautions reviewed.   2. [redacted] weeks gestation of pregnancy Anticipatory guidance about next visits/weeks of pregnancy given.   Preterm labor/first trimester warning symptoms and general obstetric precautions including but not limited to vaginal bleeding, contractions, leaking of fluid and fetal movement were reviewed in detail with the patient.  Please refer to After Visit Summary for other counseling recommendations.   No follow-ups on file.  Future Appointments  Date Time Provider Department Center  05/15/2024 10:00 AM [facility] PROVIDER 1 [facility]  05/15/2024 10:30 AM [facility]  US3 [facility]    Jaquail Mclees E  Nicholaus, PA-C

## 2024-03-26 NOTE — Progress Notes (Signed)
 Pt presents for NOB visit. No concerns

## 2024-03-27 LAB — CBC/D/PLT+RPR+RH+ABO+RUBIGG...
Antibody Screen: NEGATIVE
Basophils Absolute: 0 x10E3/uL (ref 0.0–0.2)
Basos: 0 %
EOS (ABSOLUTE): 0 x10E3/uL (ref 0.0–0.4)
Eos: 0 %
HCV Ab: NONREACTIVE
HIV Screen 4th Generation wRfx: NONREACTIVE
Hematocrit: 37.2 % (ref 34.0–46.6)
Hemoglobin: 12.6 g/dL (ref 11.1–15.9)
Hepatitis B Surface Ag: NEGATIVE
Immature Grans (Abs): 0 x10E3/uL (ref 0.0–0.1)
Immature Granulocytes: 0 %
Lymphocytes Absolute: 1.4 x10E3/uL (ref 0.7–3.1)
Lymphs: 19 %
MCH: 34.3 pg — ABNORMAL HIGH (ref 26.6–33.0)
MCHC: 33.9 g/dL (ref 31.5–35.7)
MCV: 101 fL — ABNORMAL HIGH (ref 79–97)
Monocytes Absolute: 0.7 x10E3/uL (ref 0.1–0.9)
Monocytes: 10 %
Neutrophils Absolute: 5.1 x10E3/uL (ref 1.4–7.0)
Neutrophils: 71 %
Platelets: 325 x10E3/uL (ref 150–450)
RBC: 3.67 x10E6/uL — ABNORMAL LOW (ref 3.77–5.28)
RDW: 11.8 % (ref 11.7–15.4)
RPR Ser Ql: NONREACTIVE
Rh Factor: POSITIVE
Rubella Antibodies, IGG: 6.26 {index} (ref >0.99)
WBC: 7.2 x10E3/uL (ref 3.4–10.8)

## 2024-03-27 LAB — HCV INTERPRETATION

## 2024-03-27 LAB — HEMOGLOBIN A1C
Est. average glucose Bld gHb Est-mCnc: 82 mg/dL
Hgb A1c MFr Bld: 4.5 % — ABNORMAL LOW (ref 4.8–5.6)

## 2024-03-27 LAB — TSH RFX ON ABNORMAL TO FREE T4: TSH: 1.22 u[IU]/mL (ref 0.450–4.500)

## 2024-03-31 ENCOUNTER — Ambulatory Visit: Payer: Self-pay | Admitting: Physician Assistant

## 2024-04-01 LAB — PANORAMA PRENATAL TEST FULL PANEL:PANORAMA TEST PLUS 5 ADDITIONAL MICRODELETIONS: FETAL FRACTION: 5.5

## 2024-04-03 ENCOUNTER — Ambulatory Visit
Admission: EM | Admit: 2024-04-03 | Discharge: 2024-04-03 | Disposition: A | Discharge status: Home / Self Care | Attending: Family Medicine | Admitting: Family Medicine

## 2024-04-03 DIAGNOSIS — L299 Pruritus, unspecified: Secondary | ICD-10-CM

## 2024-04-03 DIAGNOSIS — W57XXXA Bitten or stung by nonvenomous insect and other nonvenomous arthropods, initial encounter: Secondary | ICD-10-CM | POA: Diagnosis not present

## 2024-04-03 MED ORDER — CETIRIZINE HCL 10 MG PO TABS: 10.0000 mg | ORAL_TABLET | Freq: Every day | ORAL | 0 refills | Status: DC | PRN

## 2024-04-03 MED ORDER — PREDNISONE 20 MG PO TABS: 40.0000 mg | ORAL_TABLET | Freq: Every day | ORAL | 0 refills | Status: AC

## 2024-04-03 NOTE — ED Triage Notes (Addendum)
 Pt noticed insect bites (?bed bugs) to both hands and left forearm yesterday morning. Swelling noted. C/o itching. Using Aquaphor. Pt is 3 months pregnant

## 2024-04-03 NOTE — ED Provider Notes (Signed)
 [facility]    CSN: [id number] Arrival date & time: 04/03/24  1230      History   Chief Complaint Chief Complaint  Patient presents with   Insect Bite    HPI Dawn Powell is a 18 y.o. female.   HPI Here for insect bites that are itching and red on her hands and left forearm.  She first noted them yesterday morning.  They are itching a good bit.  No trouble breathing.  No fever  NKDA  She is about [redacted] weeks pregnant. Past Medical History:  Diagnosis Date   Allergy    Asthma    childhood   Hyperthyroidism     Patient Active Problem List   Diagnosis Date Noted   Mild persistent asthma 02/27/2024   Supervision of other normal pregnancy, antepartum 02/26/2024   Hyperemesis arising during pregnancy 02/11/2024   Encounter for screening and preventative care 12/20/2023   Abnormal TSH 12/20/2023   Encounter to establish care 12/20/2023   Menorrhagia 06/22/2021   Dysmenorrhea 06/22/2021   Screening for STD (sexually transmitted disease) 06/22/2021   Frequent urination 06/22/2021   Graves disease 10/13/2014   Rash 12/04/2011   Asthma, moderate persistent 05/18/2011   Allergic rhinitis 05/18/2011   Precocious adrenarche 05/18/2011   Abdominal pain 05/18/2011    Past Surgical History:  Procedure Laterality Date   NO PAST SURGERIES      OB History     Gravida  1   Para  0   Term  0   Preterm  0   AB  0   Living  0      SAB  0   IAB  0   Ectopic  0   Multiple  0   Live Births  0            Home Medications    Prior to Admission medications   Medication Sig Start Date End Date Taking? Authorizing Provider  cetirizine  (ZYRTEC  ALLERGY) 10 MG tablet Take 1 tablet (10 mg total) by mouth daily as needed for allergies. 04/03/24  Yes Vonna Sharlet POUR, MD  predniSONE  (DELTASONE ) 20 MG tablet Take 2 tablets (40 mg total) by mouth daily with breakfast for 3 days. 04/03/24 04/06/24 Yes Vonna Sharlet POUR, MD  Prenatal Vit-Fe  Phos-FA-Omega (VITAFOL  GUMMIES) 3.33-0.333-34.8 MG CHEW Chew 3 tablets by mouth daily. 02/26/24  Yes Eveline Lynwood MATSU, MD  aspirin  EC 81 MG tablet Take 1 tablet (81 mg total) by mouth daily. Swallow whole. Patient not taking: Reported on 04/03/2024 03/26/24   Davis, Devon E, PA-C  Blood Pressure Monitoring (BLOOD PRESSURE KIT) DEVI 1 Device by Does not apply route once a week. 02/26/24   Eveline Lynwood MATSU, MD    Family History Family History  Problem Relation Age of Onset   Thyroid  disease Mother    Hypertension Father     Social History Social History   Tobacco Use   Smoking status: Never   Smokeless tobacco: Never  Vaping Use   Vaping status: Never Used  Substance Use Topics   Alcohol use: Never   Drug use: Not Currently    Types: Marijuana    Comment: Stopped end of September     Allergies   Patient has no known allergies.   Review of Systems Review of Systems   Physical Exam Triage Vital Signs ED Triage Vitals  Encounter Vitals Group     BP 04/03/24 1342 119/76     Girls Systolic BP Percentile --  Girls Diastolic BP Percentile --      Boys Systolic BP Percentile --      Boys Diastolic BP Percentile --      Pulse Rate 04/03/24 1342 90     Resp 04/03/24 1342 18     Temp 04/03/24 1342 98.4 F (36.9 C)     Temp Source 04/03/24 1342 Oral     SpO2 04/03/24 1342 98 %     Weight --      Height --      Head Circumference --      Peak Flow --      Pain Score 04/03/24 1341 0     Pain Loc --      Pain Education --      Exclude from Growth Chart --    No data found.  Updated Vital Signs BP 119/76 (BP Location: Left Arm)   Pulse 90   Temp 98.4 F (36.9 C) (Oral)   Resp 18   LMP 12/30/2023 (Exact Date)   SpO2 98%   Visual Acuity Right Eye Distance:   Left Eye Distance:   Bilateral Distance:    Right Eye Near:   Left Eye Near:    Bilateral Near:     Physical Exam Vitals reviewed.  Constitutional:      General: She is not in acute distress.     Appearance: She is not ill-appearing, toxic-appearing or diaphoretic.  Cardiovascular:     Rate and Rhythm: Normal rate and regular rhythm.     Heart sounds: No murmur heard. Pulmonary:     Effort: Pulmonary effort is normal. No respiratory distress.     Breath sounds: Normal breath sounds. No stridor. No wheezing, rhonchi or rales.  Skin:    Coloration: Skin is not jaundiced or pale.     Comments: There are raised erythematous areas, ranging in size from 3 to 4 mm.   Neurological:     General: No focal deficit present.     Mental Status: She is alert and oriented to person, place, and time.  Psychiatric:        Behavior: Behavior normal.      UC Treatments / Results  Labs (all labs ordered are listed, but only abnormal results are displayed) Labs Reviewed - No data to display  EKG   Radiology No results found.  Procedures Procedures (including critical care time)  Medications Ordered in UC Medications - No data to display  Initial Impression / Assessment and Plan / UC Course  I have reviewed the triage vital signs and the nursing notes.  Pertinent labs & imaging results that were available during my care of the patient were reviewed by me and considered in my medical decision making (see chart for details).     Zyrtec  is sent in for the itching and a 3-day burst of prednisone  is sent in for the reaction to the insect bites. Final Clinical Impressions(s) / UC Diagnoses   Final diagnoses:  Itching  Reaction to insect bite     Discharge Instructions      Zyrtec /cetirizine  10 mg tablet--take 1 daily as needed for allergy or itching.  Take prednisone  20 mg--2 daily for 3 days      ED Prescriptions     Medication Sig Dispense Auth. Provider   predniSONE  (DELTASONE ) 20 MG tablet Take 2 tablets (40 mg total) by mouth daily with breakfast for 3 days. 6 tablet Vonna Sharlet POUR, MD   cetirizine  (ZYRTEC  ALLERGY) 10 MG tablet  Take 1 tablet (10 mg total) by  mouth daily as needed for allergies. 10 tablet Vonna Arilynn Blakeney K, MD      PDMP not reviewed this encounter.   Vonna Sharlet POUR, MD 04/03/24 [phone]

## 2024-04-03 NOTE — Discharge Instructions (Signed)
 Zyrtec /cetirizine  10 mg tablet--take 1 daily as needed for allergy or itching.  Take prednisone  20 mg--2 daily for 3 days

## 2024-04-04 LAB — HORIZON CUSTOM: REPORT SUMMARY: NEGATIVE

## 2024-04-23 ENCOUNTER — Ambulatory Visit: Payer: Self-pay | Admitting: Physician Assistant

## 2024-04-23 ENCOUNTER — Encounter: Payer: Self-pay | Admitting: Physician Assistant

## 2024-04-23 VITALS — BP 119/76 | HR 90 | Wt 145.6 lb

## 2024-04-23 DIAGNOSIS — E0500 Thyrotoxicosis with diffuse goiter without thyrotoxic crisis or storm: Secondary | ICD-10-CM

## 2024-04-23 DIAGNOSIS — O219 Vomiting of pregnancy, unspecified: Secondary | ICD-10-CM | POA: Diagnosis not present

## 2024-04-23 DIAGNOSIS — Z3A16 16 weeks gestation of pregnancy: Secondary | ICD-10-CM

## 2024-04-23 DIAGNOSIS — R7989 Other specified abnormal findings of blood chemistry: Secondary | ICD-10-CM | POA: Diagnosis not present

## 2024-04-23 DIAGNOSIS — Z3480 Encounter for supervision of other normal pregnancy, unspecified trimester: Secondary | ICD-10-CM

## 2024-04-23 MED ORDER — DOXYLAMINE-PYRIDOXINE 10-10 MG PO TBEC: 2.0000 | DELAYED_RELEASE_TABLET | Freq: Every day | ORAL | 2 refills | Status: DC

## 2024-04-23 NOTE — Progress Notes (Signed)
 PRENATAL VISIT NOTE  Subjective:  Dawn Powell is a 18 y.o. G1P0000 at 107w3d being seen today for ongoing prenatal care.  She is currently monitored for the following issues for this low-risk pregnancy and has Asthma, moderate persistent; Allergic rhinitis; Precocious adrenarche; Abdominal pain; Rash; Encounter for screening and preventative care; Abnormal TSH; Encounter to establish care; Hyperemesis arising during pregnancy; Supervision of other normal pregnancy, antepartum; Graves disease; Mild persistent asthma; Menorrhagia; Dysmenorrhea; Screening for STD (sexually transmitted disease); and Frequent urination on their problem list.  Patient reports no complaints.  Contractions: Not present. Vag. Bleeding: None.  Movement: Absent. Denies leaking of fluid.   The following portions of the patient's history were reviewed and updated as appropriate: allergies, current medications, past family history, past medical history, past social history, past surgical history and problem list.   Objective:   Vitals:   04/23/24 1035  BP: 119/76  Pulse: 90  Weight: 145 lb 9.6 oz (66 kg)    Fetal Status:  Fetal Heart Rate (bpm): 153   Movement: Absent    General: Alert, oriented and cooperative. Patient is in no acute distress.  Skin: Skin is warm and dry. No rash noted.   Cardiovascular: Normal heart rate noted  Respiratory: Normal respiratory effort, no problems with respiration noted  Abdomen: Soft, gravid, appropriate for gestational age.  Pain/Pressure: Present     Pelvic: Cervical exam deferred        Extremities: Normal range of motion.  Edema: None  Mental Status: Normal mood and affect. Normal behavior. Normal judgment and thought content.      03/26/2024   12:29 PM 02/26/2024    9:55 AM 12/20/2023   11:06 AM  Depression screen PHQ 2/9  Decreased Interest 1 0 0  Down, Depressed, Hopeless 0 0 0  PHQ - 2 Score 1 0 0  Altered sleeping 1 0 1  Tired, decreased energy 2 0 1  Change in  appetite 0 0 1  Feeling bad or failure about yourself  0 0 0  Trouble concentrating 0 1 0  Moving slowly or fidgety/restless 0 0 0  Suicidal thoughts 0 0 0  PHQ-9 Score 4 1  3    Difficult doing work/chores   Not difficult at all     Data saved with a previous flowsheet row definition        03/26/2024   12:29 PM 02/26/2024    9:55 AM 12/20/2023   11:06 AM 07/24/2023   10:14 AM  GAD 7 : Generalized Anxiety Score  Nervous, Anxious, on Edge 0 0 0 0  Control/stop worrying 1 0 0 0  Worry too much - different things 2 0 0 0  Trouble relaxing 1 0 0 0  Restless 0 0 1 0  Easily annoyed or irritable 2 1 2  0  Afraid - awful might happen 1 1 0 0  Total GAD 7 Score 7 2 3  0  Anxiety Difficulty   Not difficult at all     Assessment and Plan:  Pregnancy: G1P0000 at [redacted]w[redacted]d  1. Supervision of other normal pregnancy, antepartum (Primary) Patient doing well, feeling regular fetal movement BP, FHR, FH appropriate   2. [redacted] weeks gestation of pregnancy Anticipatory guidance about next visits/weeks of pregnancy given.   3. Abnormal TSH 4. Graves disease 03/26/24 TSH 1.22  Preterm labor symptoms and general obstetric precautions including but not limited to vaginal bleeding, contractions, leaking of fluid and fetal movement were reviewed in detail with the patient.  Please refer to After Visit Summary for other counseling recommendations.   Return in about 4 weeks (around 05/21/2024) for LOB.  Future Appointments  Date Time Provider Department Center  05/15/2024 10:00 AM [facility] PROVIDER 1 [facility]  05/15/2024 10:30 AM [facility] US3 [facility]  05/21/2024 11:15 AM Dunn, Rollo DASEN, MD [facility] None    Jorene FORBES Moats, PA-C

## 2024-04-23 NOTE — Progress Notes (Signed)
 Pt presents for ROB visit. C/o abd cramping. Pt needs note for work and pregnancy restrictions note

## 2024-04-27 ENCOUNTER — Other Ambulatory Visit: Payer: Self-pay

## 2024-04-27 DIAGNOSIS — O219 Vomiting of pregnancy, unspecified: Secondary | ICD-10-CM

## 2024-04-27 MED ORDER — DICLEGIS 10-10 MG PO TBEC: 2.0000 | DELAYED_RELEASE_TABLET | Freq: Every day | ORAL | 2 refills | Status: DC

## 2024-05-15 ENCOUNTER — Other Ambulatory Visit: Payer: Self-pay | Admitting: *Deleted

## 2024-05-15 ENCOUNTER — Ambulatory Visit: Attending: Obstetrics & Gynecology | Admitting: Obstetrics

## 2024-05-15 ENCOUNTER — Ambulatory Visit (HOSPITAL_BASED_OUTPATIENT_CLINIC_OR_DEPARTMENT_OTHER)

## 2024-05-15 VITALS — BP 118/56

## 2024-05-15 DIAGNOSIS — O99282 Endocrine, nutritional and metabolic diseases complicating pregnancy, second trimester: Secondary | ICD-10-CM

## 2024-05-15 DIAGNOSIS — Z3A19 19 weeks gestation of pregnancy: Secondary | ICD-10-CM

## 2024-05-15 DIAGNOSIS — E0590 Thyrotoxicosis, unspecified without thyrotoxic crisis or storm: Secondary | ICD-10-CM | POA: Diagnosis not present

## 2024-05-15 DIAGNOSIS — O321XX0 Maternal care for breech presentation, not applicable or unspecified: Secondary | ICD-10-CM | POA: Insufficient documentation

## 2024-05-15 DIAGNOSIS — Z3480 Encounter for supervision of other normal pregnancy, unspecified trimester: Secondary | ICD-10-CM | POA: Diagnosis not present

## 2024-05-15 DIAGNOSIS — O210 Mild hyperemesis gravidarum: Secondary | ICD-10-CM

## 2024-05-15 DIAGNOSIS — O09892 Supervision of other high risk pregnancies, second trimester: Secondary | ICD-10-CM

## 2024-05-15 NOTE — Progress Notes (Signed)
 MFM Consult Note  Dawn Powell is currently at [redacted]w[redacted]d. She was seen today for a detailed fetal anatomy scan due to a teenage pregnancy and history of Graves' disease that is not treated with any medications.  Her most recent TSH level drawn 1 month ago was within normal limits.  She denies any problems in her current pregnancy.    She had a cell free DNA test earlier in her pregnancy which indicated a low risk for trisomy 58, 41, and 13. A female fetus is predicted.   Sonographic findings Single intrauterine pregnancy at 19w 4d. Fetal cardiac activity:  Observed and appears normal. Presentation: Breech. The anatomic structures that were well seen appear normal. The anatomic survey is complete.  Fetal biometry shows the estimated fetal weight of 0 lb 10 oz,  287 grams (32%). Amniotic fluid: Within normal limits.  MVP: 5.35 cm. Placenta: Anterior with lakes. Adnexa: No abnormality visualized. Cervical length: 3 cm.  The patient was informed that anomalies may be missed due to technical limitations. If the fetus is in a suboptimal position or maternal habitus is increased, visualization of the fetus in the maternal uterus may be impaired.  The increased risk of IUGR associated with Graves' disease was discussed.    We will continue to follow her with growth ultrasounds throughout her pregnancy.    She should have her thyroid  function tests (TSH, free T4 levels) monitored at least once every trimester.  A follow-up exam was scheduled in 6 weeks.   The patient stated that all of her questions were answered.   A total of 30 minutes was spent counseling and coordinating the care for this patient.  Greater than 50% of the time was spent in direct face-to-face contact.

## 2024-05-16 ENCOUNTER — Other Ambulatory Visit: Payer: Self-pay

## 2024-05-16 ENCOUNTER — Inpatient Hospital Stay (HOSPITAL_COMMUNITY)
Admission: AD | Admit: 2024-05-16 | Discharge: 2024-05-16 | Disposition: A | Discharge status: Home / Self Care | Attending: Obstetrics and Gynecology | Admitting: Obstetrics and Gynecology

## 2024-05-16 DIAGNOSIS — Z3A19 19 weeks gestation of pregnancy: Secondary | ICD-10-CM | POA: Insufficient documentation

## 2024-05-16 DIAGNOSIS — O99891 Other specified diseases and conditions complicating pregnancy: Secondary | ICD-10-CM | POA: Diagnosis not present

## 2024-05-16 DIAGNOSIS — R197 Diarrhea, unspecified: Secondary | ICD-10-CM | POA: Diagnosis not present

## 2024-05-16 DIAGNOSIS — O218 Other vomiting complicating pregnancy: Secondary | ICD-10-CM | POA: Insufficient documentation

## 2024-05-16 DIAGNOSIS — K529 Noninfective gastroenteritis and colitis, unspecified: Secondary | ICD-10-CM

## 2024-05-16 MED ORDER — LACTATED RINGERS IV BOLUS
1000.0000 mL | Freq: Once | INTRAVENOUS | Status: AC
  Administered 2024-05-16: 1000 mL via INTRAVENOUS

## 2024-05-16 MED ORDER — SODIUM CHLORIDE 0.9 % IV SOLN
8.0000 mg | Freq: Once | INTRAVENOUS | Status: AC
  Administered 2024-05-16: 8 mg via INTRAVENOUS
  Filled 2024-05-16: qty 4

## 2024-05-16 MED ORDER — ONDANSETRON 4 MG PO TBDP: 4.0000 mg | ORAL_TABLET | Freq: Three times a day (TID) | ORAL | 0 refills | Status: AC | PRN

## 2024-05-16 NOTE — MAU Note (Addendum)
 Dawn Powell is a 19 y.o. at [redacted]w[redacted]d here in MAU reporting: started throwing up and diarrhea today. States she is vomiting so much she is now throwing up blood. Unable to eat or drink anything today. Upper mid abdominal pain that is constant. States her lower back and left side is hurting. Denies any fevers. Denies any lower abdominal pain or VB.   Patient actively vomiting in triage. Vomit appears clear with no blood.   Onset of complaint: 0800 Pain score: 6 abd    0 back  Vitals:   05/16/24 1630  BP: 119/67  Pulse: (!) 123  Resp: 18  Temp: 98.6 F (37 C)  SpO2: 97%     FHT:158 Lab orders placed from triage:  UA

## 2024-05-16 NOTE — MAU Provider Note (Signed)
 " History     [id number]  Arrival date and time: 05/16/24 1615    Chief Complaint  Patient presents with   Emesis     HPI Dawn Powell is a 19 y.o. at [redacted]w[redacted]d who presents for nausea/vomiting/diarrhea. She states it started last night after eating seafood. Other people who ate with her also got sick. Endorses diarrhea, nausea and multiple bouts of vomiting. Aggravating factors include PO intake, no alleviating factors. Denies vaginal bleeding/discharge. Denies contractions or LOF.    O/Positive/-- (11/13 1047)  Past Medical History:  Diagnosis Date   Allergy    Asthma    childhood   Hyperthyroidism     Past Surgical History:  Procedure Laterality Date   NO PAST SURGERIES      Family History  Problem Relation Age of Onset   Thyroid  disease Mother    Hypertension Father    Asthma Neg Hx    Cancer Neg Hx    Diabetes Neg Hx     Social History   Socioeconomic History   Marital status: Single    Spouse name: Not on file   Number of children: Not on file   Years of education: Not on file   Highest education level: Not on file  Occupational History   Not on file  Tobacco Use   Smoking status: Never   Smokeless tobacco: Never  Vaping Use   Vaping status: Never Used  Substance and Sexual Activity   Alcohol use: Never   Drug use: Not Currently    Types: Marijuana    Comment: Stopped end of September   Sexual activity: Not Currently  Other Topics Concern   Not on file  Social History Narrative   ** Merged History Encounter **       Lives with mother [place], brothers NyJhell Stokes (2001), Tera Razor (2004), sister Kharizma Lesnick (2008). In school at [employer]   Social Drivers of Health   Tobacco Use: Low Risk (05/15/2024)   Patient History    Smoking Tobacco Use: Never    Smokeless Tobacco Use: Never    Passive Exposure: Not on file  Financial Resource Strain: Patient Declined (12/20/2023)   Overall Financial Resource Strain (CARDIA)    Difficulty of  Paying Living Expenses: Patient declined  Food Insecurity: Not on file  Transportation Needs: Not on file  Physical Activity: Inactive (12/20/2023)   Exercise Vital Sign    Days of Exercise per Week: 1 day    Minutes of Exercise per Session: 0 min  Stress: Stress Concern Present (12/20/2023)   [employer] of Occupational Health - Occupational Stress Questionnaire    Feeling of Stress: To some extent  Social Connections: Unknown (12/20/2023)   Social Connection and Isolation Panel    Frequency of Communication with Friends and Family: Once a week    Frequency of Social Gatherings with Friends and Family: Never    Attends Religious Services: 1 to 4 times per year    Active Member of [employer] or Organizations: No    Attends Engineer, Structural: Not on file    Marital Status: Not on file  Intimate Partner Violence: Not on file  Depression (PHQ2-9): Low Risk (03/26/2024)   Depression (PHQ2-9)    PHQ-2 Score: 4  Alcohol Screen: Not on file  Housing: Not on file  Utilities: Not on file  Health Literacy: Not on file    Allergies[1]  Medications Ordered Prior to Encounter[2]  Pertinent positives and negative per HPI, all others  reviewed and negative  Physical Exam   BP 119/67 (BP Location: Right Arm)   Pulse (!) 123   Temp 98.6 F (37 C) (Oral)   Resp 18   Ht 5' 5 (1.651 m)   Wt 68.7 kg   LMP 12/30/2023 (Exact Date)   SpO2 97%   BMI 25.21 kg/m   Patient Vitals for the past 24 hrs:  BP Temp Temp src Pulse Resp SpO2 Height Weight  05/16/24 1630 119/67 98.6 F (37 C) Oral (!) 123 18 97 % 5' 5 (1.651 m) 68.7 kg    Physical Exam Vitals and nursing note reviewed.  Constitutional:      Appearance: She is well-developed.  HENT:     Head: Normocephalic and atraumatic.     Mouth/Throat:     Mouth: Mucous membranes are moist.  Eyes:     Extraocular Movements: Extraocular movements intact.  Cardiovascular:     Rate and Rhythm: Normal rate and regular rhythm.   Pulmonary:     Effort: Pulmonary effort is normal.  Abdominal:     Palpations: Abdomen is soft.     Tenderness: There is abdominal tenderness.  Skin:    Capillary Refill: Capillary refill takes less than 2 seconds.  Neurological:     General: No focal deficit present.     Mental Status: She is alert.      Labs No results found for this or any previous visit (from the past 24 hours).  Imaging No results found.  MAU Course  Procedures Lab Orders  No laboratory test(s) ordered today   Meds ordered this encounter  Medications   lactated ringers  bolus 1,000 mL   ondansetron  (ZOFRAN ) 8 mg in sodium chloride  0.9 % 50 mL IVPB   ondansetron  (ZOFRAN -ODT) 4 MG disintegrating tablet    Sig: Take 1 tablet (4 mg total) by mouth every 8 (eight) hours as needed for nausea or vomiting.    Dispense:  20 tablet    Refill:  0   Imaging Orders  No imaging studies ordered today    MDM Moderate  Assessment and Plan  Gastroenteritis  [redacted] weeks gestation of pregnancy   Powell Dawn is a 19 y.o. at [redacted]w[redacted]d who presents for nausea/vomiting/diarrhea.   -Given 1L of LR with 8mg  zofran  with resolution of symptoms -Patient able to tolerate PO intake during MAU stay.   -Stable for discharge home -Short Rx of zofran  sent to patient pharmacy -Advised patient on probable clinical course of food borne gastroenteritis -All questions answered, anticipatory guidance and detailed return precautions provided.    Rashidi Loh L Rachelann Enloe, MD/MHA 05/16/2024 5:38 PM  Allergies as of 05/16/2024   No Known Allergies      Medication List     STOP taking these medications    aspirin  EC 81 MG tablet   Blood Pressure Kit Devi   cetirizine  10 MG tablet Commonly known as: ZyrTEC  Allergy   Diclegis  10-10 MG Tbec Generic drug: Doxylamine -Pyridoxine        TAKE these medications    ondansetron  4 MG disintegrating tablet Commonly known as: ZOFRAN -ODT Take 1 tablet (4 mg total) by mouth every 8  (eight) hours as needed for nausea or vomiting.   Vitafol  Gummies 3.33-0.333-34.8 MG Chew Chew 3 tablets by mouth daily.           [1] No Known Allergies [2]  No current facility-administered medications on file prior to encounter.   Current Outpatient Medications on File Prior to Encounter  Medication Sig Dispense  Refill   aspirin  EC 81 MG tablet Take 1 tablet (81 mg total) by mouth daily. Swallow whole. (Patient not taking: Reported on 05/15/2024) 30 tablet 12   Blood Pressure Monitoring (BLOOD PRESSURE KIT) DEVI 1 Device by Does not apply route once a week. (Patient not taking: Reported on 05/15/2024) 1 each 0   cetirizine  (ZYRTEC  ALLERGY) 10 MG tablet Take 1 tablet (10 mg total) by mouth daily as needed for allergies. (Patient not taking: Reported on 05/15/2024) 10 tablet 0   DICLEGIS  10-10 MG TBEC Take 2 tablets by mouth at bedtime. If symptoms persist, add one tablet in the morning and one in the afternoon (Patient not taking: Reported on 05/15/2024) 100 tablet 2   Prenatal Vit-Fe Phos-FA-Omega (VITAFOL  GUMMIES) 3.33-0.333-34.8 MG CHEW Chew 3 tablets by mouth daily. 90 tablet 11   "

## 2024-05-21 ENCOUNTER — Ambulatory Visit (INDEPENDENT_AMBULATORY_CARE_PROVIDER_SITE_OTHER): Payer: Self-pay | Admitting: Obstetrics and Gynecology

## 2024-05-21 VITALS — BP 116/69 | HR 99 | Wt 152.2 lb

## 2024-05-21 DIAGNOSIS — Z3402 Encounter for supervision of normal first pregnancy, second trimester: Secondary | ICD-10-CM | POA: Diagnosis not present

## 2024-05-21 DIAGNOSIS — Z3480 Encounter for supervision of other normal pregnancy, unspecified trimester: Secondary | ICD-10-CM

## 2024-05-21 DIAGNOSIS — E0500 Thyrotoxicosis with diffuse goiter without thyrotoxic crisis or storm: Secondary | ICD-10-CM | POA: Diagnosis not present

## 2024-05-21 DIAGNOSIS — Z3A20 20 weeks gestation of pregnancy: Secondary | ICD-10-CM | POA: Diagnosis not present

## 2024-05-21 NOTE — Progress Notes (Signed)
 "  PRENATAL VISIT NOTE  Subjective:  Dawn Powell is a 19 y.o. G1P0000 at [redacted]w[redacted]d being seen today for ongoing prenatal care.  She is currently monitored for the following issues for this low-risk pregnancy and has Asthma, moderate persistent; Allergic rhinitis; Precocious adrenarche; Abdominal pain; Rash; Encounter for screening and preventative care; Abnormal TSH; Encounter to establish care; Hyperemesis arising during pregnancy; Supervision of other normal pregnancy, antepartum; Graves disease; Mild persistent asthma; Menorrhagia; Dysmenorrhea; Screening for STD (sexually transmitted disease); and Frequent urination on their problem list.  Patient reports no complaints.  Contractions: Not present.  .  Movement: Present. Denies leaking of fluid.   The following portions of the patient's history were reviewed and updated as appropriate: allergies, current medications, past family history, past medical history, past social history, past surgical history and problem list.   Objective:   Vitals:   05/21/24 1123  BP: 116/69  Pulse: 99  Weight: 152 lb 3.2 oz (69 kg)    Fetal Status:      Movement: Present    General: Alert, oriented and cooperative. Patient is in no acute distress.  Skin: Skin is warm and dry. No rash noted.   Cardiovascular: Normal heart rate noted  Respiratory: Normal respiratory effort, no problems with respiration noted  Abdomen: Soft, gravid, appropriate for gestational age.  Pain/Pressure: Absent     Pelvic: Cervical exam deferred        Extremities: Normal range of motion.  Edema: None  Mental Status: Normal mood and affect. Normal behavior. Normal judgment and thought content.      03/26/2024   12:29 PM 02/26/2024    9:55 AM 12/20/2023   11:06 AM  Depression screen PHQ 2/9  Decreased Interest 1 0 0  Down, Depressed, Hopeless 0 0 0  PHQ - 2 Score 1 0 0  Altered sleeping 1 0 1  Tired, decreased energy 2 0 1  Change in appetite 0 0 1  Feeling bad or failure  about yourself  0 0 0  Trouble concentrating 0 1 0  Moving slowly or fidgety/restless 0 0 0  Suicidal thoughts 0 0 0  PHQ-9 Score 4 1  3    Difficult doing work/chores   Not difficult at all     Data saved with a previous flowsheet row definition        03/26/2024   12:29 PM 02/26/2024    9:55 AM 12/20/2023   11:06 AM 07/24/2023   10:14 AM  GAD 7 : Generalized Anxiety Score  Nervous, Anxious, on Edge 0 0 0 0  Control/stop worrying 1 0 0 0  Worry too much - different things 2 0 0 0  Trouble relaxing 1 0 0 0  Restless 0 0 1 0  Easily annoyed or irritable 2 1 2  0  Afraid - awful might happen 1 1 0 0  Total GAD 7 Score 7 2 3  0  Anxiety Difficulty   Not difficult at all     Assessment and Plan:  Pregnancy: G1P0000 at [redacted]w[redacted]d 1. Supervision of other normal pregnancy, antepartum (Primary) Anticipatory guidance  Normal anatomy - AFP, Serum, Open Spina Bifida  2. Graves disease Pt reportedly in remission, no meds currently TFTs q trimester Serial growth - TSH - T4, free - T3, free  3. [redacted] weeks gestation of pregnancy - AFP, Serum, Open Spina Bifida   Preterm labor symptoms and general obstetric precautions including but not limited to vaginal bleeding, contractions, leaking of fluid and fetal movement were  reviewed in detail with the patient. Please refer to After Visit Summary for other counseling recommendations.   No follow-ups on file.  Future Appointments  Date Time Provider Department Center  06/18/2024 10:55 AM Abigail, Rollo DASEN, MD [facility] None  06/26/2024 10:15 AM [facility] PROVIDER 1 [facility]  06/26/2024 10:30 AM [facility]    Rollo DASEN Abigail, MD "

## 2024-05-23 LAB — AFP, SERUM, OPEN SPINA BIFIDA
AFP MoM: 0.88
AFP Value: 52.8 ng/mL
Gest. Age on Collection Date: 20 wk
Maternal Age At EDD: 19.5 a
OSBR Risk 1 IN: 10000
Test Results:: NEGATIVE
Weight: 154 [lb_av]

## 2024-05-23 LAB — TSH: TSH: 1.09 u[IU]/mL (ref 0.450–4.500)

## 2024-05-23 LAB — T3, FREE: T3, Free: 3.1 pg/mL (ref 2.3–5.0)

## 2024-05-23 LAB — T4, FREE: Free T4: 1.14 ng/dL (ref 0.93–1.60)

## 2024-05-25 ENCOUNTER — Ambulatory Visit: Payer: Self-pay | Admitting: Obstetrics and Gynecology

## 2024-05-25 DIAGNOSIS — E0500 Thyrotoxicosis with diffuse goiter without thyrotoxic crisis or storm: Secondary | ICD-10-CM

## 2024-05-25 DIAGNOSIS — Z3480 Encounter for supervision of other normal pregnancy, unspecified trimester: Secondary | ICD-10-CM

## 2024-06-18 ENCOUNTER — Ambulatory Visit: Payer: Self-pay | Admitting: Obstetrics and Gynecology

## 2024-06-18 ENCOUNTER — Encounter: Payer: Self-pay | Admitting: Obstetrics and Gynecology

## 2024-06-18 VITALS — BP 120/71 | HR 122 | Wt 161.8 lb

## 2024-06-18 DIAGNOSIS — E0500 Thyrotoxicosis with diffuse goiter without thyrotoxic crisis or storm: Secondary | ICD-10-CM

## 2024-06-18 DIAGNOSIS — Z3480 Encounter for supervision of other normal pregnancy, unspecified trimester: Secondary | ICD-10-CM

## 2024-06-18 NOTE — Progress Notes (Signed)
" ° °  PRENATAL VISIT NOTE  Subjective:  Dawn Powell is a 19 y.o. G1P0000 at [redacted]w[redacted]d being seen today for ongoing prenatal care.  She is currently monitored for the following issues for this low-risk pregnancy and has Asthma, moderate persistent; Allergic rhinitis; Precocious adrenarche; Abnormal TSH; Hyperemesis arising during pregnancy; Supervision of other normal pregnancy, antepartum; Graves disease; Mild persistent asthma; Menorrhagia; and Dysmenorrhea on their problem list.  Patient reports no complaints.  Contractions: Not present. Vag. Bleeding: None.  Movement: Present. Denies leaking of fluid.   The following portions of the patient's history were reviewed and updated as appropriate: allergies, current medications, past family history, past medical history, past social history, past surgical history and problem list.   Objective:   Vitals:   06/18/24 1103  BP: 120/71  Pulse: (!) 122  Weight: 161 lb 12.8 oz (73.4 kg)    Fetal Status:  Fetal Heart Rate (bpm): 147 Fundal Height: 24 cm Movement: Present    General: Alert, oriented and cooperative. Patient is in no acute distress.  Skin: Skin is warm and dry. No rash noted.   Cardiovascular: Normal heart rate noted  Respiratory: Normal respiratory effort, no problems with respiration noted  Abdomen: Soft, gravid, appropriate for gestational age.  Pain/Pressure: Absent     Pelvic: Cervical exam deferred        Extremities: Normal range of motion.  Edema: None  Mental Status: Normal mood and affect. Normal behavior. Normal judgment and thought content.     Assessment and Plan:  Pregnancy: G1P0000 at [redacted]w[redacted]d 1. Supervision of other normal pregnancy, antepartum (Primary) Anticipatory guidance  S/p anatomy visit Follow up ultrasound 2/13 Declines flu shot Third trimester labs next visit  2. Graves disease Last TFTs within normal limits, repeat with third trimester labs  Preterm labor symptoms and general obstetric precautions  including but not limited to vaginal bleeding, contractions, leaking of fluid and fetal movement were reviewed in detail with the patient. Please refer to After Visit Summary for other counseling recommendations.   Return in about 4 weeks (around 07/16/2024) for GTT/TFTs.  Future Appointments  Date Time Provider Department Center  06/26/2024 10:15 AM [facility] PROVIDER 1 [facility]  06/26/2024 10:30 AM [facility]    Rollo ONEIDA Bring, MD  "

## 2024-06-18 NOTE — Progress Notes (Signed)
 Pt presents for ROB visit. No concerns

## 2024-06-26 ENCOUNTER — Ambulatory Visit

## 2024-07-17 ENCOUNTER — Encounter: Payer: Self-pay | Admitting: Obstetrics & Gynecology

## 2024-07-17 ENCOUNTER — Other Ambulatory Visit: Payer: Self-pay
